# Patient Record
Sex: Female | Born: 1940 | Race: Black or African American | Hispanic: No | Smoking: Never smoker
Health system: Southern US, Community
[De-identification: ages and names within clinical notes are randomized; demographics above are authoritative.]

## PROBLEM LIST (undated history)

## (undated) DIAGNOSIS — H9190 Unspecified hearing loss, unspecified ear: Secondary | ICD-10-CM

## (undated) DIAGNOSIS — K7460 Unspecified cirrhosis of liver: Secondary | ICD-10-CM

## (undated) DIAGNOSIS — B1920 Unspecified viral hepatitis C without hepatic coma: Secondary | ICD-10-CM

## (undated) DIAGNOSIS — H6690 Otitis media, unspecified, unspecified ear: Secondary | ICD-10-CM

## (undated) DIAGNOSIS — J189 Pneumonia, unspecified organism: Secondary | ICD-10-CM

## (undated) DIAGNOSIS — M069 Rheumatoid arthritis, unspecified: Secondary | ICD-10-CM

## (undated) HISTORY — PX: INNER EAR SURGERY: SHX679

## (undated) HISTORY — DX: Pneumonia, unspecified organism: J18.9

## (undated) HISTORY — DX: Unspecified cirrhosis of liver: K74.60

## (undated) HISTORY — DX: Unspecified viral hepatitis C without hepatic coma: B19.20

## (undated) HISTORY — PX: EYE SURGERY: SHX253

---

## 1998-02-20 ENCOUNTER — Emergency Department (HOSPITAL_COMMUNITY): Admission: EM | Admit: 1998-02-20 | Discharge: 1998-02-20 | Payer: Self-pay | Admitting: Emergency Medicine

## 2000-06-01 ENCOUNTER — Emergency Department (HOSPITAL_COMMUNITY): Admission: EM | Admit: 2000-06-01 | Discharge: 2000-06-01 | Payer: Self-pay | Admitting: Emergency Medicine

## 2000-06-01 ENCOUNTER — Encounter: Payer: Self-pay | Admitting: Emergency Medicine

## 2001-02-10 ENCOUNTER — Emergency Department (HOSPITAL_COMMUNITY): Admission: EM | Admit: 2001-02-10 | Discharge: 2001-02-10 | Payer: Self-pay | Admitting: Emergency Medicine

## 2002-11-02 ENCOUNTER — Encounter: Admission: RE | Admit: 2002-11-02 | Discharge: 2002-11-02 | Payer: Self-pay | Admitting: Otolaryngology

## 2002-11-02 ENCOUNTER — Encounter: Payer: Self-pay | Admitting: Otolaryngology

## 2005-07-04 ENCOUNTER — Emergency Department (HOSPITAL_COMMUNITY): Admission: EM | Admit: 2005-07-04 | Discharge: 2005-07-04 | Payer: Self-pay | Admitting: Emergency Medicine

## 2006-03-28 ENCOUNTER — Emergency Department (HOSPITAL_COMMUNITY): Admission: EM | Admit: 2006-03-28 | Discharge: 2006-03-28 | Payer: Self-pay | Admitting: Emergency Medicine

## 2006-04-30 ENCOUNTER — Emergency Department (HOSPITAL_COMMUNITY): Admission: EM | Admit: 2006-04-30 | Discharge: 2006-05-01 | Payer: Self-pay | Admitting: Emergency Medicine

## 2006-05-17 ENCOUNTER — Encounter: Admission: RE | Admit: 2006-05-17 | Discharge: 2006-05-17 | Payer: Self-pay | Admitting: Otolaryngology

## 2007-03-21 ENCOUNTER — Emergency Department (HOSPITAL_COMMUNITY): Admission: EM | Admit: 2007-03-21 | Discharge: 2007-03-21 | Payer: Self-pay | Admitting: Emergency Medicine

## 2007-04-28 ENCOUNTER — Ambulatory Visit: Payer: Self-pay | Admitting: Internal Medicine

## 2007-04-28 LAB — CONVERTED CEMR LAB
ALT: 33 units/L (ref 0–35)
AST: 39 units/L — ABNORMAL HIGH (ref 0–37)
Albumin: 3.4 g/dL — ABNORMAL LOW (ref 3.5–5.2)
Alkaline Phosphatase: 81 units/L (ref 39–117)
BUN: 16 mg/dL (ref 6–23)
Basophils Absolute: 0 10*3/uL (ref 0.0–0.1)
Basophils Relative: 0.9 % (ref 0.0–1.0)
Bilirubin, Direct: 0.1 mg/dL (ref 0.0–0.3)
Calcium: 9.1 mg/dL (ref 8.4–10.5)
Chloride: 111 meq/L (ref 96–112)
Folate: 18.1 ng/mL
GFR calc Af Amer: 129 mL/min
GFR calc non Af Amer: 107 mL/min
Glucose, Bld: 84 mg/dL (ref 70–99)
HCT: 33.3 % — ABNORMAL LOW (ref 36.0–46.0)
HCV Ab: POSITIVE — AB
Hemoglobin: 10.7 g/dL — ABNORMAL LOW (ref 12.0–15.0)
Hep A IgM: NEGATIVE
Hep B C IgM: NEGATIVE
Hepatitis B Surface Ag: NEGATIVE
MCHC: 32 g/dL (ref 30.0–36.0)
Monocytes Absolute: 0.6 10*3/uL (ref 0.2–0.7)
Monocytes Relative: 11.7 % — ABNORMAL HIGH (ref 3.0–11.0)
RBC: 4.54 M/uL (ref 3.87–5.11)
RDW: 13.6 % (ref 11.5–14.6)
Saturation Ratios: 15.2 % — ABNORMAL LOW (ref 20.0–50.0)
TSH: 0.47 microintl units/mL (ref 0.35–5.50)
Vitamin B-12: 498 pg/mL (ref 211–911)

## 2007-05-04 ENCOUNTER — Encounter: Admission: RE | Admit: 2007-05-04 | Discharge: 2007-05-04 | Payer: Self-pay | Admitting: Internal Medicine

## 2007-05-04 ENCOUNTER — Ambulatory Visit: Payer: Self-pay | Admitting: Internal Medicine

## 2007-05-19 ENCOUNTER — Ambulatory Visit: Payer: Self-pay | Admitting: Internal Medicine

## 2007-05-19 LAB — CONVERTED CEMR LAB: Prothrombin Time: 11.7 s (ref 10.9–13.3)

## 2007-05-29 IMAGING — CT CT ORBIT/TEMPORAL/IAC W/O CM
4 of 6 series · 14 of 30 positions shown, 15 images · IV contrast (agent unspecified)
Comparison: Head CT, 04/30/06

CLINICAL DATA: 64 year old female with chronic ear disease and perforation.  Bilateral chronic otomastoiditis.  
CT TEMPORAL BONES WITHOUT CONTRAST:
TECHNIQUE: Axial and coronal plane CT imaging of the petrous temporal bones was performed with thin-collimation image reconstruction.  No intravenous contrast was administered.

[Series 2: coronal/check recons 2 & 3 · axial · 0.39mm/px · z∈[+10,+55]mm · 4 of 112 slices shown, 5 images]
[im 23/112  brain]
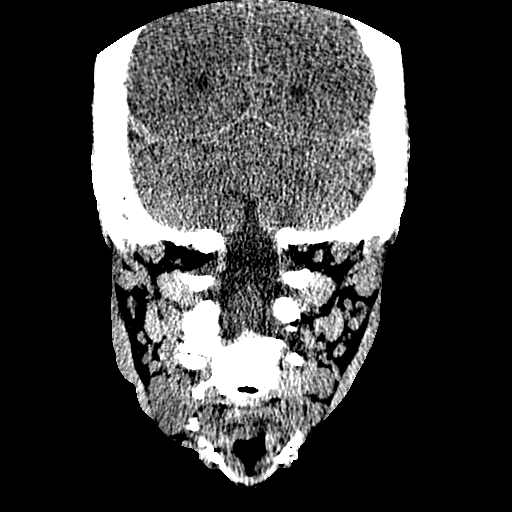
[im 23/112  bone]
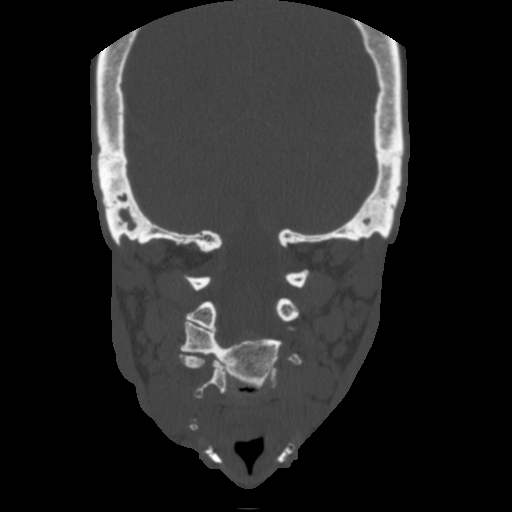
[im 45/112  bone]
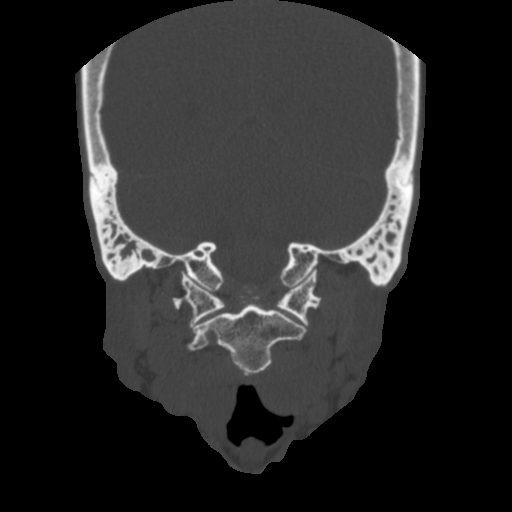
[im 67/112  bone]
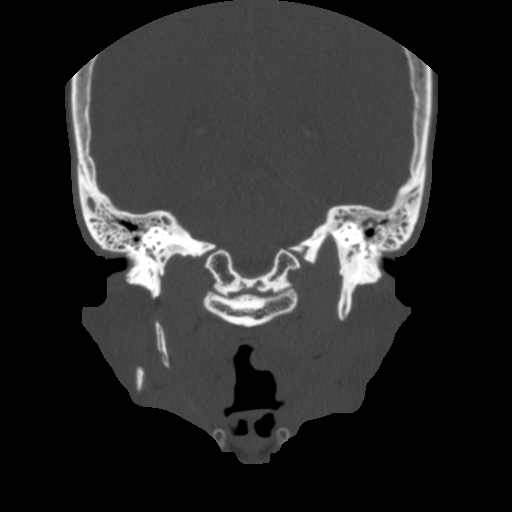
[im 89/112  bone]
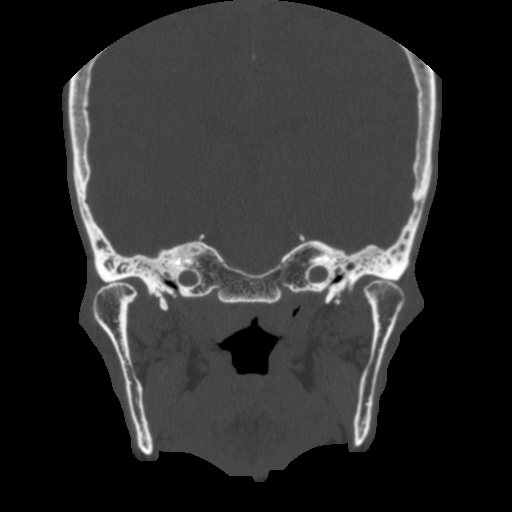

[Series 3: recon 2: coronal/check recons 2 · axial · 0.20mm/px · z∈[-8,+36]mm · 4 of 112 slices shown]
[im 23/112  bone]
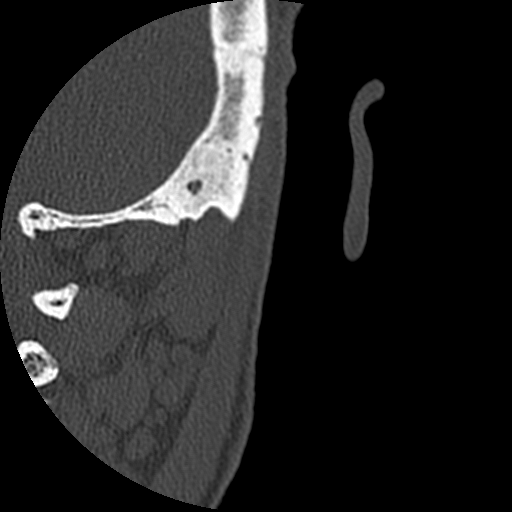
[im 45/112  bone]
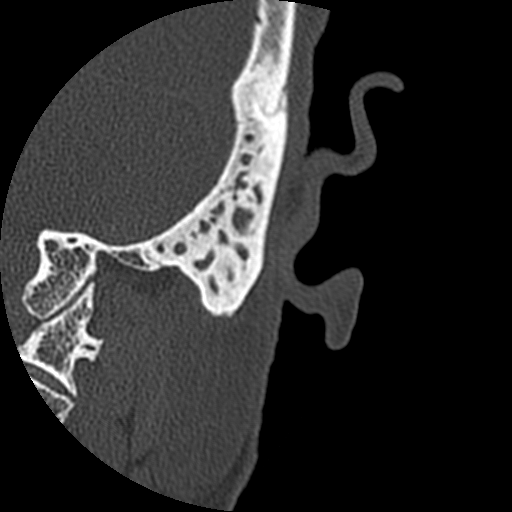
[im 67/112  bone]
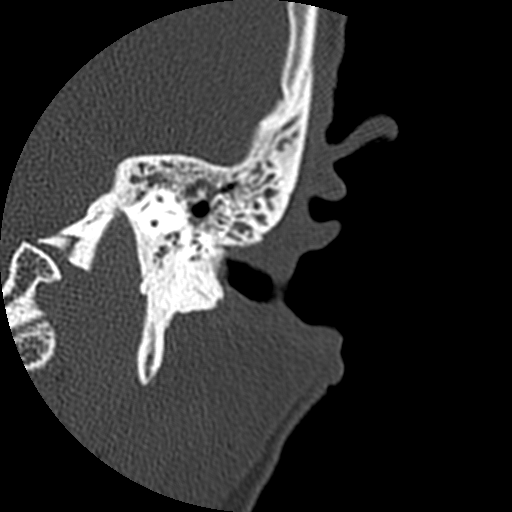
[im 89/112  bone]
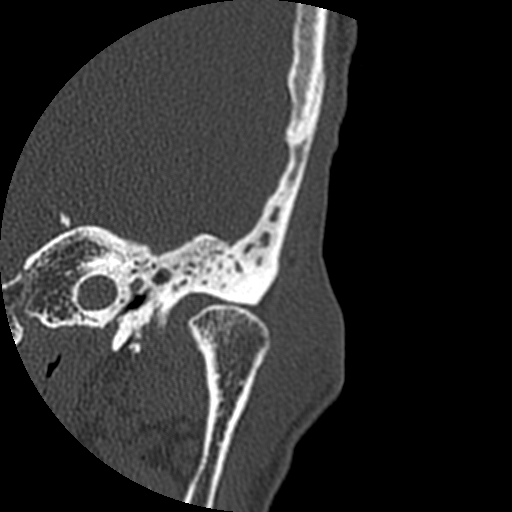

[Series 4: recon 3: coronal/check recons 2 · axial · 0.20mm/px · z∈[-8,+36]mm · 4 of 112 slices shown]
[im 23/112  bone]
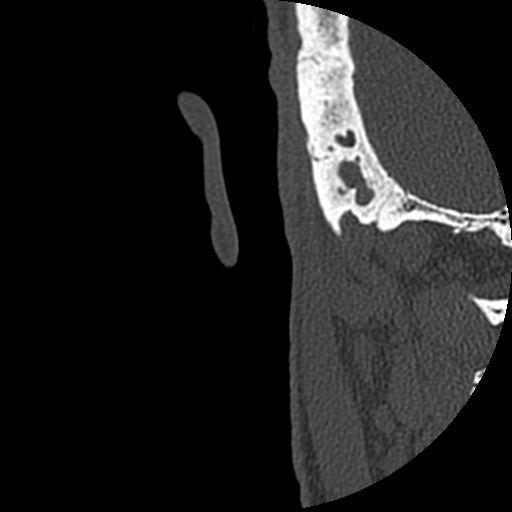
[im 45/112  bone]
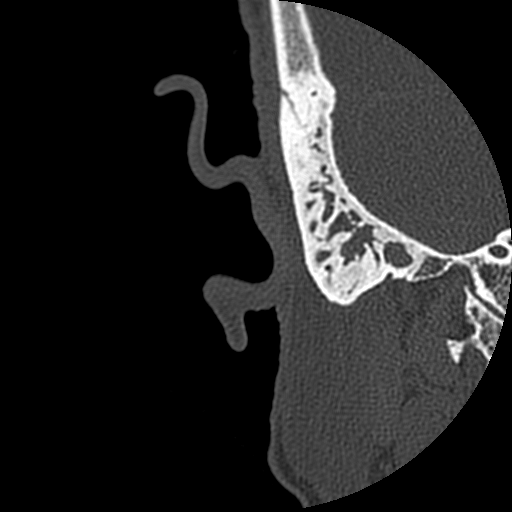
[im 67/112  bone]
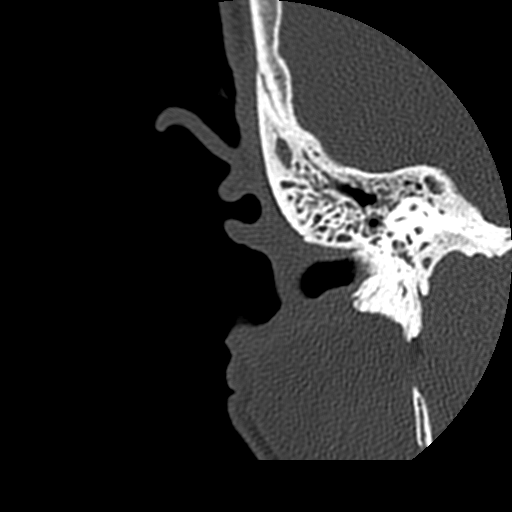
[im 89/112  bone]
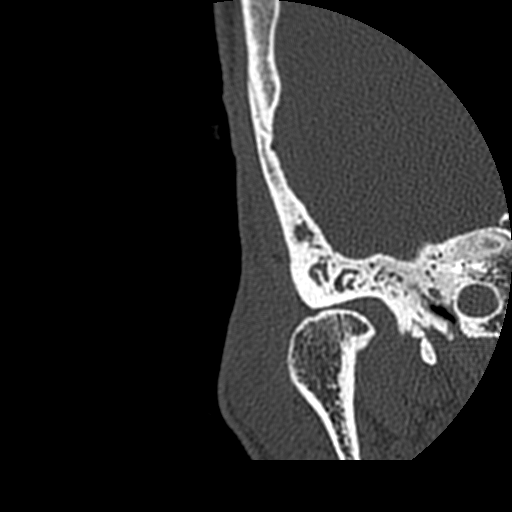

[Series 6: axial/check for recons 2 & 3 · axial · 0.33mm/px · z∈[-0,+16]mm · 2 of 80 slices shown]
[im 27/80  bone]
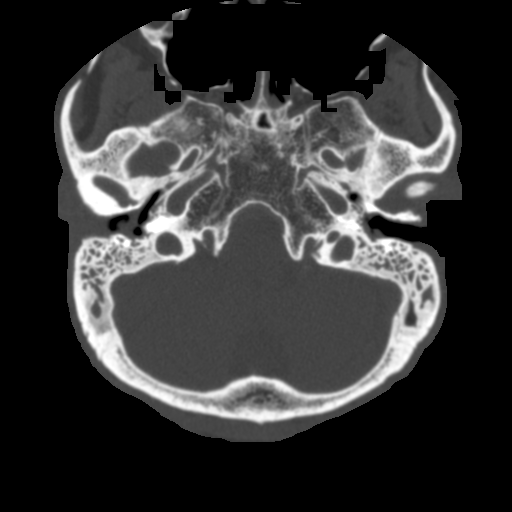
[im 53/80  bone]
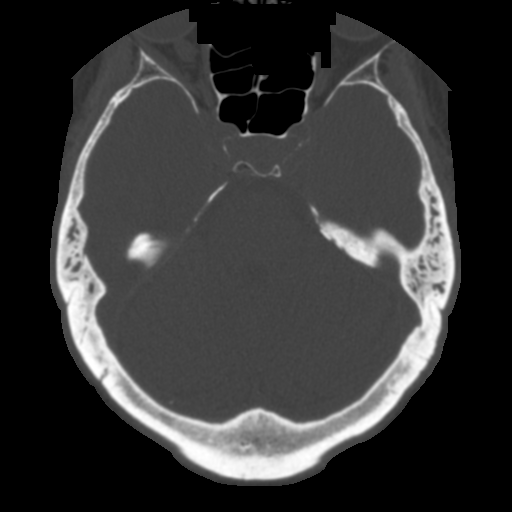

[14 of 30 positions shown; findings below may reference images not displayed]

FINDINGS: Calcifications within the right ear pinna are redemonstrated.  Limited imaging of the brain is unremarkable.  
Imaging of the right temporal bone redemonstrates findings of chronic otomastoiditis.  There is sclerosis of the bone and chronic effusion.  There is thickening along the medial aspect of the external auditory canal and tympanic membrane where there appears to be perforation of the inferior aspect of the tympanic membrane and the middle ear ossicles appear to be intact.  There is no definite osseous destruction.  Inner ear structures are normally formed.  There is prominent soft tissue within the middle ear cavity.  This is slightly more prominent than on the study of 6994.  
Similar findings of sclerosis and opacification of the left mastoid air cells is evident.  There is chronic perforation of the left TM as was seen on the study of 6994.  Abnormal soft tissue is present within the middle ear cavity, but without osseous destruction.  The middle ear ossicles are intact.  Inner ear structures are normally formed.
IMPRESSION: 1.  Bilateral chronic otomastoiditis.
2.  Persistent soft tissue within the middle ear cavity likely inflammatory in nature, but without discrete mass or osseous destruction to suggest the diagnosis of cholesteatoma. 
3.  Chronic perforation of the left tympanic membrane.

## 2007-06-08 ENCOUNTER — Ambulatory Visit: Payer: Self-pay | Admitting: Gastroenterology

## 2007-06-22 ENCOUNTER — Ambulatory Visit: Payer: Self-pay | Admitting: Gastroenterology

## 2007-06-27 ENCOUNTER — Encounter: Payer: Self-pay | Admitting: Internal Medicine

## 2007-06-27 ENCOUNTER — Ambulatory Visit: Payer: Self-pay | Admitting: Internal Medicine

## 2007-06-27 DIAGNOSIS — H409 Unspecified glaucoma: Secondary | ICD-10-CM | POA: Insufficient documentation

## 2007-06-27 DIAGNOSIS — M1710 Unilateral primary osteoarthritis, unspecified knee: Secondary | ICD-10-CM

## 2007-06-27 DIAGNOSIS — Z8659 Personal history of other mental and behavioral disorders: Secondary | ICD-10-CM | POA: Insufficient documentation

## 2007-06-27 DIAGNOSIS — H60509 Unspecified acute noninfective otitis externa, unspecified ear: Secondary | ICD-10-CM

## 2007-06-27 DIAGNOSIS — B1710 Acute hepatitis C without hepatic coma: Secondary | ICD-10-CM

## 2007-06-27 DIAGNOSIS — E119 Type 2 diabetes mellitus without complications: Secondary | ICD-10-CM | POA: Insufficient documentation

## 2007-06-27 DIAGNOSIS — H7090 Unspecified mastoiditis, unspecified ear: Secondary | ICD-10-CM | POA: Insufficient documentation

## 2007-06-27 DIAGNOSIS — J309 Allergic rhinitis, unspecified: Secondary | ICD-10-CM | POA: Insufficient documentation

## 2007-07-06 ENCOUNTER — Telehealth (INDEPENDENT_AMBULATORY_CARE_PROVIDER_SITE_OTHER): Payer: Self-pay | Admitting: *Deleted

## 2007-07-07 ENCOUNTER — Ambulatory Visit: Payer: Self-pay | Admitting: Internal Medicine

## 2007-07-14 ENCOUNTER — Emergency Department (HOSPITAL_COMMUNITY): Admission: EM | Admit: 2007-07-14 | Discharge: 2007-07-14 | Payer: Self-pay | Admitting: Emergency Medicine

## 2007-07-15 ENCOUNTER — Telehealth: Payer: Self-pay | Admitting: Internal Medicine

## 2007-07-19 ENCOUNTER — Encounter: Payer: Self-pay | Admitting: Internal Medicine

## 2007-07-20 ENCOUNTER — Ambulatory Visit: Payer: Self-pay | Admitting: Internal Medicine

## 2007-07-20 DIAGNOSIS — L02818 Cutaneous abscess of other sites: Secondary | ICD-10-CM

## 2007-07-20 DIAGNOSIS — F22 Delusional disorders: Secondary | ICD-10-CM | POA: Insufficient documentation

## 2007-07-20 DIAGNOSIS — L03818 Cellulitis of other sites: Secondary | ICD-10-CM

## 2007-07-20 LAB — CONVERTED CEMR LAB
BUN: 10 mg/dL (ref 6–23)
Cholesterol: 204 mg/dL (ref 0–200)
Creatinine, Ser: 0.7 mg/dL (ref 0.4–1.2)
GFR calc Af Amer: 108 mL/min
HDL: 43.3 mg/dL (ref >39.0)
Hgb A1c MFr Bld: 6.2 % — ABNORMAL HIGH (ref 4.6–6.0)
Potassium: 3.8 meq/L (ref 3.5–5.1)
Sodium: 143 meq/L (ref 135–145)
Triglycerides: 157 mg/dL — ABNORMAL HIGH (ref 0–149)
VLDL: 31 mg/dL (ref 0–40)

## 2007-07-21 ENCOUNTER — Telehealth (INDEPENDENT_AMBULATORY_CARE_PROVIDER_SITE_OTHER): Payer: Self-pay | Admitting: *Deleted

## 2007-07-25 ENCOUNTER — Ambulatory Visit: Payer: Self-pay | Admitting: Internal Medicine

## 2007-07-25 DIAGNOSIS — D649 Anemia, unspecified: Secondary | ICD-10-CM

## 2007-07-26 ENCOUNTER — Telehealth (INDEPENDENT_AMBULATORY_CARE_PROVIDER_SITE_OTHER): Payer: Self-pay | Admitting: *Deleted

## 2007-07-26 LAB — CONVERTED CEMR LAB
Iron: 94 ug/dL (ref 42–145)
Transferrin: 269.8 mg/dL (ref >=212.0)
Vitamin B-12: 605 pg/mL (ref 211–911)

## 2007-08-24 ENCOUNTER — Ambulatory Visit: Payer: Self-pay | Admitting: Internal Medicine

## 2007-08-24 DIAGNOSIS — H6690 Otitis media, unspecified, unspecified ear: Secondary | ICD-10-CM | POA: Insufficient documentation

## 2007-09-16 ENCOUNTER — Ambulatory Visit: Payer: Self-pay | Admitting: Gastroenterology

## 2007-09-16 ENCOUNTER — Encounter: Payer: Self-pay | Admitting: Internal Medicine

## 2007-12-09 ENCOUNTER — Encounter: Payer: Self-pay | Admitting: Internal Medicine

## 2007-12-26 ENCOUNTER — Encounter: Admission: RE | Admit: 2007-12-26 | Discharge: 2007-12-26 | Payer: Self-pay | Admitting: Orthopedic Surgery

## 2008-03-03 ENCOUNTER — Other Ambulatory Visit: Payer: Self-pay

## 2008-03-03 ENCOUNTER — Emergency Department: Payer: Self-pay | Admitting: Emergency Medicine

## 2008-03-22 ENCOUNTER — Ambulatory Visit: Payer: Self-pay | Admitting: Gastroenterology

## 2008-05-01 ENCOUNTER — Emergency Department: Payer: Self-pay | Admitting: Emergency Medicine

## 2011-01-19 ENCOUNTER — Other Ambulatory Visit: Payer: Self-pay | Admitting: Otolaryngology

## 2011-01-19 DIAGNOSIS — H7010 Chronic mastoiditis, unspecified ear: Secondary | ICD-10-CM

## 2011-01-23 ENCOUNTER — Ambulatory Visit
Admission: RE | Admit: 2011-01-23 | Discharge: 2011-01-23 | Disposition: A | Discharge status: Home / Self Care | Payer: Medicare Other | Source: Ambulatory Visit | Attending: Otolaryngology | Admitting: Otolaryngology

## 2011-01-23 DIAGNOSIS — H7010 Chronic mastoiditis, unspecified ear: Secondary | ICD-10-CM

## 2011-06-11 ENCOUNTER — Emergency Department (HOSPITAL_COMMUNITY): Payer: Medicare Other

## 2011-06-11 ENCOUNTER — Emergency Department (HOSPITAL_COMMUNITY)
Admission: EM | Admit: 2011-06-11 | Discharge: 2011-06-12 | Disposition: A | Discharge status: Home / Self Care | Payer: Medicare Other | Source: Home / Self Care | Attending: Emergency Medicine | Admitting: Emergency Medicine

## 2011-06-11 LAB — COMPREHENSIVE METABOLIC PANEL
ALT: 33 U/L (ref 0–35)
AST: 56 U/L — ABNORMAL HIGH (ref 0–37)
Albumin: 3.5 g/dL (ref 3.5–5.2)
Alkaline Phosphatase: 85 U/L (ref 39–117)
Potassium: 4.2 mEq/L (ref 3.5–5.1)
Sodium: 142 mEq/L (ref 135–145)
Total Protein: 8.6 g/dL — ABNORMAL HIGH (ref 6.0–8.3)

## 2011-06-11 LAB — CBC
Platelets: 366 10*3/uL (ref 150–400)
RBC: 5.19 MIL/uL — ABNORMAL HIGH (ref 3.87–5.11)
RDW: 13.6 % (ref 11.5–15.5)
WBC: 7.7 10*3/uL (ref 4.0–10.5)

## 2011-06-11 LAB — URINALYSIS, ROUTINE W REFLEX MICROSCOPIC
Bilirubin Urine: NEGATIVE
Ketones, ur: NEGATIVE mg/dL
Nitrite: NEGATIVE
Protein, ur: NEGATIVE mg/dL
pH: 6 (ref 5.0–8.0)

## 2011-06-11 LAB — RAPID URINE DRUG SCREEN, HOSP PERFORMED
Amphetamines: NOT DETECTED
Barbiturates: NOT DETECTED
Benzodiazepines: NOT DETECTED
Cocaine: NOT DETECTED
Tetrahydrocannabinol: NOT DETECTED

## 2011-06-11 LAB — DIFFERENTIAL
Eosinophils Absolute: 0 10*3/uL (ref 0.0–0.7)
Eosinophils Relative: 0 % (ref 0–5)
Lymphocytes Relative: 13 % (ref 12–46)
Monocytes Absolute: 0.5 10*3/uL (ref 0.1–1.0)
Neutrophils Relative %: 80 % — ABNORMAL HIGH (ref 43–77)

## 2011-06-11 LAB — ETHANOL: Alcohol, Ethyl (B): <11 mg/dL (ref 0–11)

## 2011-06-12 ENCOUNTER — Inpatient Hospital Stay (HOSPITAL_COMMUNITY)
Admission: AD | Admit: 2011-06-12 | Discharge: 2011-06-15 | DRG: 884 | Disposition: A | Discharge status: Home / Self Care | Payer: Medicare Other | Attending: Psychiatry | Admitting: Psychiatry

## 2011-06-12 DIAGNOSIS — F0390 Unspecified dementia without behavioral disturbance: Secondary | ICD-10-CM

## 2011-06-12 DIAGNOSIS — IMO0002 Reserved for concepts with insufficient information to code with codable children: Secondary | ICD-10-CM

## 2011-06-12 DIAGNOSIS — M810 Age-related osteoporosis without current pathological fracture: Secondary | ICD-10-CM

## 2011-06-12 DIAGNOSIS — H9190 Unspecified hearing loss, unspecified ear: Secondary | ICD-10-CM

## 2011-06-12 DIAGNOSIS — X58XXXA Exposure to other specified factors, initial encounter: Secondary | ICD-10-CM

## 2011-06-12 DIAGNOSIS — G309 Alzheimer's disease, unspecified: Secondary | ICD-10-CM

## 2011-06-12 DIAGNOSIS — F0280 Dementia in other diseases classified elsewhere without behavioral disturbance: Secondary | ICD-10-CM

## 2011-06-12 DIAGNOSIS — F29 Unspecified psychosis not due to a substance or known physiological condition: Secondary | ICD-10-CM

## 2011-06-13 LAB — VITAMIN B12: Vitamin B-12: 986 pg/mL — ABNORMAL HIGH (ref 211–911)

## 2011-06-13 LAB — RPR: RPR Ser Ql: NONREACTIVE

## 2011-06-13 LAB — TSH: TSH: 0.505 u[IU]/mL (ref 0.350–4.500)

## 2011-06-13 NOTE — Assessment & Plan Note (Signed)
Carol Palmer, PATES NO.:  [id number]  MEDICAL RECORD NO.:  [id number]  LOCATION:  0403                          FACILITY:  [facility]  PHYSICIAN:  Eulogio Ditch, MD DATE OF BIRTH:  1941/07/30  DATE OF ADMISSION:  06/12/2011 DATE OF DISCHARGE:                      PSYCHIATRIC ADMISSION ASSESSMENT   HISTORY OF PRESENT ILLNESS:  The patient is here on petition and was seen with Dr. Rogers Blocker.  Her petition papers state that she is seeing and hearing things that are not there, wandering the streets.  The patient was assessed in the emergency department with altered mental status.  She was unable and unable to provide information, endorsing visual hallucinations, seeing strangers in her house, one in her closet, two behind the couch.  She states that she left and went to her neighbors.  She also was endorsing being "sprinkled with dust by boys and girls that smoked her."  She denies any auditory hallucinations. Denies any suicidal or homicidal thoughts.  She reports having difficulty hearing and is on medications for cataracts.  PAST PSYCHIATRIC HISTORY:  First admission to [facility]. No current or past psychiatric treatment.  SOCIAL HISTORY:  The patient is 70 years old, she lives alone.  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  FAMILY HISTORY:  None.  ALCOHOL AND DRUG HISTORY:  She denies any alcohol or substance use.  PRIMARY CARE PROVIDER:  The patient list [facility].  MEDICAL PROBLEMS:  A history of chronic mastoiditis that was noted on her CT scan and cataracts, hard of hearing.  MEDICATIONS:  Are unavailable at this time.  DRUG ALLERGIES:  None noted.  PHYSICAL EXAMINATION:  The patient was seen in the emergency department where it was noted the patient was agitated and hostile.  She had a chest x-ray that was within normal limits.  Urinalysis was negative. Urine drug  screen was negative.  Salicylate level less than 2, alcohol level 11, glucose of 116, RBC of 5.19 and ER had also noted that the patient had a washcloth near her buttocks and was noted to have a stage II ulcer.  MENTAL STATUS EXAM:  She is alert, asking to go home.  She is somewhat unkempt, very thin in appearance, having some problems with long-term memory.  Unable to recall exact dates.  Very open about her visual hallucinations.  Does not appear to be fearful and she denies any auditory hallucinations or suicidal or homicidal thoughts.  DIAGNOSES:  Axis I:  Dementia, NOS, behavioral disturbance. Axis II:  Deferred. Axis III:  History of cataracts, hard of hearing. Axis IV:  Deferred at this time. Axis V:  Current is 30.  PLAN:  Is to obtain further labs, TSH, B12 and folate level.  We will have Ensure available for nutritional supplements.  We will have Haldol at bedtime to aid with psychotic symptoms.  Contact daughter for concerns and baseline.  We will also have nursing monitor her wound and aid with her ADLs.  Her tentative length of stay at this time is 3-4 days.     Landry Corporal, N.P.   ______________________________ Eulogio Ditch, MD  JO/MEDQ  D:  06/12/2011  T:  06/12/2011  Job:  [id number]  Electronically Signed by Limmie Patricia.P. on 06/12/2011 01:57:49 PM Electronically Signed by Eulogio Ditch  on 06/13/2011 11:38:26 AM

## 2011-06-15 LAB — FOLATE RBC: RBC Folate: 1237 ng/mL — ABNORMAL HIGH (ref >=366)

## 2011-06-23 LAB — BASIC METABOLIC PANEL
BUN: 15
Creatinine, Ser: 1.03
GFR calc non Af Amer: 54 — ABNORMAL LOW

## 2011-06-23 LAB — CBC
MCV: 72.1 — ABNORMAL LOW
Platelets: 650 — ABNORMAL HIGH
RDW: 13.6
WBC: 4.5

## 2011-06-23 LAB — DIFFERENTIAL
Basophils Absolute: 0
Eosinophils Absolute: 0.2
Lymphs Abs: 1.7
Neutrophils Relative %: 49

## 2011-06-23 LAB — URINALYSIS, ROUTINE W REFLEX MICROSCOPIC
Ketones, ur: NEGATIVE
Nitrite: NEGATIVE
Protein, ur: NEGATIVE

## 2011-07-03 ENCOUNTER — Other Ambulatory Visit: Payer: Self-pay | Admitting: Nurse Practitioner

## 2011-07-03 ENCOUNTER — Ambulatory Visit
Admission: RE | Admit: 2011-07-03 | Discharge: 2011-07-03 | Disposition: A | Discharge status: Home / Self Care | Payer: Medicare Other | Source: Ambulatory Visit | Attending: Nurse Practitioner | Admitting: Nurse Practitioner

## 2011-07-03 DIAGNOSIS — R05 Cough: Secondary | ICD-10-CM

## 2011-07-09 NOTE — Discharge Summary (Signed)
Carol Palmer, Carol Palmer             ACCOUNT NO.:  [id number]  MEDICAL RECORD NO.:  [id number]  LOCATION:  0402                          FACILITY:  [facility]  PHYSICIAN:  Eulogio Ditch, MD DATE OF BIRTH:  12-29-1940  DATE OF ADMISSION:  06/12/2011 DATE OF DISCHARGE:  06/15/2011                              DISCHARGE SUMMARY   IDENTIFYING INFORMATION:  This is a 70 year old female.  This is an involuntary admission.  HISTORY OF PRESENT ILLNESS:  Carol Palmer presents with some psychotic symptoms.  Complained of seeing strangers in her house, people walking around, someone hiding in the closet, two behind the sofa in her living room.  Denying any auditory hallucinations and denied any dangerous thoughts.  This is a 70 year old female who lives alone.  Has 3 children.  Previously worked as a Conservator, museum/gallery.  MEDICAL EVALUATION:  She was medically evaluated in our emergency room where a full physical exam and review of systems was documented. Urinalysis was noted to be negative.  Urine drug screen negative. Salicylate level less than 2.  Alcohol screen negative.  PE revealed a stage II decubitus ulcer on her buttocks.  COURSE OF HOSPITALIZATION:  She was admitted to our dual diagnoses unit and given a provisional diagnosis of psychosis NOS, rule out dementia. She was initially given trazodone 50 mg at night p.r.n. insomnia, Haldol 2 mg p.o. at bedtime. Additional diagnostic studies revealed a TSH of 0.505, B12 level of 986 ng/mL and RPR nonreactive.  She was initially evaluated by Dr. Kathryne Sharper and transferred to the care of Dr. Rogers Blocker on the day of discharge.  We learned she had no previous history of taking psychotropic medications or psychiatric hospitalizations.  CT scan had been performed and was found to be negative for brain atrophy.  She gave [place] permission to speak with her daughter, who confirmed that her history is psychiatric problems was negative.  Daughter  pointed out that the patient is hard of hearing and sometimes misunderstands people and replies inappropriately to questions.  She had previously seen a medical doctor who had been concerned about the possibility of dementia.  She tolerated the Haldol well, but her thought process remained circumstantial but no suicidal or other dangerous thoughts.  She continued to have no dangerous ideas, was not agitated and was ready for discharge on October 8 tolerating the Haldol well.  Family was involved and was in agreement with helping her follow up with further medical evaluations.  She was ready for discharge by October 8.  DISCHARGE PLAN:  To follow up with Starling Manns at [facility] October 15 at 10:40.  DISCHARGE DIAGNOSES:  Axis I:  Dementia, NOS. Axis II:  Deferred. Axis III:  No diagnosis. Axis IV:  No diagnosis. Axis V:  Current 55, past year not known.  DISCHARGE MEDICATIONS ARE: 1. Haldol 2 mg at bedtime. 2. Dorzolamide ophthalmic 2% 1 drop in the left eye q.12 hours. 3. Elocon 0.1% ophthalmic drops, 1 drop both eyes once daily. 4. Lumigan 0.3 mg ophthalmic 1 drop in the left eye daily. 5. Pilocarpine 1 drop in the left eye 4 times daily. 6. Prevacid 30 mg daily. 7. Timolol ophthalmic solution 0.5% 1  drop in the left eye daily.     Margaret A. Lorin Picket, N.P.   ______________________________ Eulogio Ditch, MD    MAS/MEDQ  D:  07/07/2011  T:  07/08/2011  Job:  [id number]  Electronically Signed by Kari Baars N.P. on 07/09/2011 08:33:15 AM Electronically Signed by Eulogio Ditch  on 07/09/2011 12:09:22 PM

## 2011-08-24 ENCOUNTER — Ambulatory Visit (INDEPENDENT_AMBULATORY_CARE_PROVIDER_SITE_OTHER): Payer: Medicare Other | Admitting: Internal Medicine

## 2011-08-24 ENCOUNTER — Encounter: Payer: Self-pay | Admitting: Internal Medicine

## 2011-08-24 VITALS — BP 116/69 | HR 76 | Temp 98.2°F | Wt 142.0 lb

## 2011-08-24 DIAGNOSIS — F22 Delusional disorders: Secondary | ICD-10-CM

## 2011-08-24 NOTE — Progress Notes (Signed)
INFECTIOUS DISEASES INITIAL CLINIC VISIT  RFV: chronic pseudomonas infection/delusional parasitosis  Patient ID: Carol Palmer, female   DOB: [birth year], 70 y.o.   MRN: [id number] Carol Palmer is a 70yo AAF with history of HepC, DM, recurrent OM. She has been seen by her PCP in beginning of November 2012. The patient reports just finishing a course of Bactrim for strep and pseudomonas. She states that she has chronic infection of her ears which has been diagnosed by her PCP as well as been seen by various ENTs and other subspecialist. She states that she feels the pseudomonas is travelling through her scalps causing seeds to come out. Secondly, she feels that this infection is coursing through her body, feeding from her GU tract, and thus that is why is it being found in her urine. She states that she is overwhelmed by this fungus and has collected samples to show at this visit. She is interested in a cure. She states that roughly ever 30days she notices bumps arising behind her ear and on her scalp. They never ulcerate, but do cause her to itch. Theses lesions are not necessarily localized to head and neck but she says that they are on her back and legs. She states that this infection has caused her to lose her hair and eyebrows. She would like [place] to culture her ears, skin, rectum, vagina, under her nails, and under her toes to understand the extent of her illness. She denies any recent trauma to ears, she does not use q-tips  She denies fever/chills/nightsweats/ear pain/ear drainage/dysuria. She does feel that she has pruritis and arthralgias due to osteoarthritis.  ROS: Review of Systems  Constitutional: Negative for fever, chills, diaphoresis, activity change, appetite change, fatigue and unexpected weight change.  HENT: Negative for congestion, sore throat, rhinorrhea, sneezing, trouble swallowing and sinus pressure.  Eyes: Negative for photophobia and visual disturbance.  Respiratory: she has  intermittent cough. no chest tightness, shortness of breath, wheezing and stridor.  Cardiovascular: Negative for chest pain, palpitations and leg swelling.  Gastrointestinal: Negative for nausea, vomiting, abdominal pain, diarrhea, constipation, blood in stool, abdominal distention and anal bleeding.  Genitourinary: Negative for dysuria, hematuria, flank pain and difficulty urinating.  Musculoskeletal: Negative for myalgias, back pain, joint swelling, arthralgias and gait problem.  Skin: Negative for color change, pallor, rash and wound.  Neurological: Negative for dizziness, tremors, weakness and light-headedness.  Hematological: Negative for adenopathy. Does not bruise/bleed easily.  Psychiatric/Behavioral: Negative for behavioral problems, confusion, sleep disturbance, dysphoric mood, decreased concentration and agitation.    No Known Allergies   Active Ambulatory Problems    Diagnosis Date Noted  . HEPATITIS C 06/27/2007  . DIABETES MELLITUS, TYPE II 06/27/2007  . UNSPECIFIED ANEMIA 07/25/2007  . DELUSIONAL DISORDER 07/20/2007  . GLAUCOMA NOS 06/27/2007  . OTITIS EXTERNA, ACUTE NEC 06/27/2007  . OTITIS MEDIA, ACUTE, BILATERAL 08/24/2007  . MASTOIDITIS NOS 06/27/2007  . ALLERGIC RHINITIS 06/27/2007  . CELLULITIS, SCALP 07/20/2007  . 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 HX, PERSONAL, MENTAL DISORDER NOS 06/27/2007   Resolved Ambulatory Problems    Diagnosis Date Noted  . No Resolved Ambulatory Problems   No Additional Past Medical History   Current Outpatient Prescriptions  Medication Sig Dispense Refill  . dorzolamide (TRUSOPT) 2 % ophthalmic solution Apply 1 drop to eye 3 (three) times daily.        Marland Kitchen HYDROcodone-acetaminophen (VICODIN) 5-500 MG per tablet Take 1 tablet by mouth every 6 (six) hours as needed.        Marland Kitchen  lansoprazole (PREVACID) 30 MG capsule Take 30 mg by mouth every 12 (twelve) hours. Take 1 cap by mouth every 12 hours before meals in combination  with amoxicillin and clarithromycin.       . mometasone (ELOCON) 0.1 % cream Apply 1 application topically daily.        Marland Kitchen neomycin-polymyxin-hydrocortisone (CORTISPORIN) 3.5-10000-1 otic suspension Place 4 drops in ear(s) 3 (three) times daily.        . pilocarpine (PILOCAR) 1 % ophthalmic solution Apply 1 drop to eye every 12 (twelve) hours.        Marland Kitchen sulfamethoxazole-trimethoprim (BACTRIM DS,SEPTRA DS) 800-160 MG per tablet Take 1 tablet by mouth 2 (two) times daily.        . timolol (BETIMOL) 0.5 % ophthalmic solution Apply 1 drop to eye.         History  Substance Use Topics  . Smoking status: Never Smoker   . Smokeless tobacco: Never Used  . 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 She has short grey hair underneath her wig. No alopecia. No scars. No LAD  Eyes:    PERRL, conjunctiva/corneas clear, EOM's intact, fundi    benign, both eyes  Ears:    EAC is clear, non-erythamatous. TM mild scarring. Non-bulging, no lesions  Nose:   Nares normal, septum midline, mucosa normal, no drainage    or sinus tenderness  Throat:   Lips, mucosa, and tongue normal; wears dentures and gums normal  Neck:   Supple, symmetrical, trachea midline, no adenopathy;    thyroid:  no enlargement/tenderness/nodules; no carotid   bruit or JVD  Back:     Symmetric, no curvature, ROM normal, multiple healed hyperpigmented scars for skin lesions or escoriatoin  Lungs:     Clear to auscultation bilaterally, respirations unlabored      Heart:    Regular rate and rhythm, S1 and S2 normal, no murmur, rub    or gallop  Breast Exam:    No tenderness, masses, or nipple abnormality  Abdomen:     Soft, non-tender, bowel sounds active all four quadrants,    no masses, no organomegaly  Buttock:   numerous scarred lesions, hyperpigmented bilaterally, no open lesions     Extremities:   Extremities normal, atraumatic, no cyanosis or edema  Pulses:   2+ and symmetric all extremities  Skin:   As previously mentioned by body site  Lymph nodes:   Cervical, supraclavicular, and axillary nodes normal    ASSESSMENT:    Delusional Parasitosis , 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  At present, she does not exhibit any active cellulitis, nor Otitis Externa. We will not give any further antibiotic courses. I have asked her to see her PCP if she has ongoing infections  She may benefit from taking an antipsychotic, such as geodon or zyprexa. Or referred to psychiatrist for her illness.  It is a pleasure to see Carol Palmer. If you have further questions, please feel free to contact me.  Duke Salvia Drue Second MD MPH [facility] [phone]

## 2011-10-29 ENCOUNTER — Ambulatory Visit (INDEPENDENT_AMBULATORY_CARE_PROVIDER_SITE_OTHER): Payer: Medicare Other | Admitting: Gastroenterology

## 2011-10-29 DIAGNOSIS — B182 Chronic viral hepatitis C: Secondary | ICD-10-CM

## 2011-10-29 DIAGNOSIS — D649 Anemia, unspecified: Secondary | ICD-10-CM

## 2011-10-29 LAB — IBC PANEL

## 2011-10-29 LAB — TSH: TSH: 0.203 u[IU]/mL — ABNORMAL LOW (ref 0.350–4.500)

## 2011-10-30 LAB — COMPLETE METABOLIC PANEL WITH GFR
ALT: 39 U/L — ABNORMAL HIGH (ref 0–35)
AST: 49 U/L — ABNORMAL HIGH (ref 0–37)
Albumin: 3.9 g/dL (ref 3.5–5.2)
Alkaline Phosphatase: 78 U/L (ref 39–117)
Potassium: 4.2 mEq/L (ref 3.5–5.3)
Sodium: 139 mEq/L (ref 135–145)
Total Bilirubin: 0.4 mg/dL (ref 0.3–1.2)
Total Protein: 7.6 g/dL (ref 6.0–8.3)

## 2011-10-30 LAB — CBC WITH DIFFERENTIAL/PLATELET
Basophils Absolute: 0 10*3/uL (ref 0.0–0.1)
Basophils Relative: 0 % (ref 0–1)
Eosinophils Absolute: 0.2 10*3/uL (ref 0.0–0.7)
Hemoglobin: 12 g/dL (ref 12.0–15.0)
MCHC: 30.2 g/dL (ref 30.0–36.0)
Monocytes Relative: 9 % (ref 3–12)
Neutro Abs: 1.8 10*3/uL (ref 1.7–7.7)
Neutrophils Relative %: 35 % — ABNORMAL LOW (ref 43–77)
Platelets: 301 10*3/uL (ref 150–400)
RDW: 13.4 % (ref 11.5–15.5)

## 2011-10-30 LAB — HEPATITIS B CORE ANTIBODY, TOTAL: Hep B Core Total Ab: POSITIVE — AB

## 2011-10-30 LAB — ANTI-NUCLEAR AB-TITER (ANA TITER): ANA Titer 1: 1:160 {titer} — ABNORMAL HIGH

## 2011-10-30 LAB — IRON AND TIBC
%SAT: 28 % (ref 20–55)
TIBC: 380 ug/dL (ref 250–470)

## 2011-10-30 LAB — AFP TUMOR MARKER: AFP-Tumor Marker: 1.6 ng/mL (ref 0.0–8.0)

## 2011-10-30 LAB — ANA: Anti Nuclear Antibody(ANA): POSITIVE — AB

## 2011-10-30 LAB — HEPATITIS B SURFACE ANTIBODY,QUALITATIVE: Hep B S Ab: POSITIVE — AB

## 2011-11-05 NOTE — Progress Notes (Addendum)
NAMEJOHNATHAN, TORTORELLI    MR#:  [id number]      DATE:  10/29/2011  DOB:  [birth year]    cc: Referring physician:  Bebe Liter, FNP-C c/o Dorothyann Peng, MD, [facility], [address], [place], [place] [address], [place] [phone]    REASON FOR REFERRAL:  Positive hepatitis C antibody.   History:  The patient is a 71 year old woman who I have been asked to see in consultation by Ms. Ethelene Browns regarding a positive hepatitis C antibody.  According to the patient, she had no knowledge of her history of hepatitis C until I gather as part of routine lab testing on 04/23/2011, she was found to have abnormal liver tests with an ALT of  36 and an AST of 48. This led to subsequent lab testing on 05/26/2011, that found she was hepatitis B surface antigen negative, but hepatitis C antibody positive. There are no symptoms directly referable to her  history of hepatitis C nor are there symptoms to suggest cryoglobulin mediated or decompensated liver disease.  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 She denies any history  of intravenous or intranasal drug use, tattoos or blood transfusion prior to 1992. She is unsure as to whether she has had her ears pierced in a sterile or unsterile fashion. There is no family history  of liver disease. She has not been vaccinated against hepatitis A or B that she can recall.   PAST MEDICAL HISTORY:  She denies any coronary artery disease, hypertension, dyslipidemia, or dysthyroidism, or diabetes.   PAST SURGICAL HISTORY:  Surgery for glaucoma.    Past psychiatric history:  Denies.   CURRENT MEDICATIONS:  Pilocarpine 2% 1 drop to each eye q.i.d., dorzolamide HCL 2% 1 drop left eye q. 12 hours, timolol 0.5% one drop left eye q. 12 hours, Lumigan 0.03% one drop left eye daily, olive leaf  150 mg daily for joint pains, garlic 1000 mg p.o. daily for joint pains.    Allergies:  Denies.    Habits:  Smoking never. Alcohol as above.   Family history:  As above.    Social history:  She is a retired religious Teacher, music. She is widowed and has 3 children, 1 of her children, a daughter, lives in [place].   REVIEW OF SYSTEMS:  All 10 systems reviewed today on the review of systems form, which was signed and placed in the chart.  CES-D is 3.   The patient reports that once a week she walks on her own unassisted to the grocery store, which is approximately 20 blocks away, and is  able carry her purchases in bags on her own back without having to stop for shortness of breath or chest pain.   PHYSICAL EXAMINATION:   Constitutional:  Appeared stated age without significant peripheral wasting. Vital signs: Height 61 inches, weight 146 pounds, blood pressure 146/87, pulse 57, temperature 96.5 Fahrenheit.  Ears, nose,  mouth, and throat: Unremarkable oropharynx with dentures. There is no thyromegaly or neck masses.  CHEST:  Resonant to percussion. Clear to auscultation.  CARDIOVASCULAR:  Normal S1, S2. No murmurs or rubs. There is no peripheral edema.  ABDOMINAL EXAM:  Normal bowel sounds. No masses or tenderness. I could not appreciate liver edge or spleen tip. Lymphatics: No cervical or  inguinal lymphadenopathy. Central nervous system:  No asterixis or focal  neurologic findings.  Dermatologic:  Anicteric. No palmar erythema. Eyes anicteric sclera. Pupils: Equal and reactive to light.   LABORATORY STUDIES:  From 04/23/2011, CBC was unremarkable other than MCV of 72. Her total bilirubin was 0.3, albumin 4, globulins 4, ALP 95, AST 48, ALT 36.  From 05/26/2011, her TSH was 0.717, which is normal. Hepatitis B surface antigen was negative, C antibody was positive. Triglycerides were 78. ALT was 32 and AST was 41.   Assessment:  The patient is a 71 year old woman with a  history of a positive hepatitis C antibody with preserved synthetic function. There are no obvious contraindications to treating the patient. She is somewhat  older than our usual patients, but reports excellent exercise tolerance. It would still be of value, if she was to be treated for hepatitis C, to get a stress test, which I will have to ask the  patient's referring doctor to do, because I do not have privileges to order this in [place]. Treatment for hepatitis C may induce anemia that could provoke previously unrecognized coronary disease. In addition, I will have to further characterize her disease by genotype and biopsy if genotype 1.  In my discussion today with the patient, I discussed the nature and natural history of hepatitis C. We discussed the significance of genotyping. We discussed the role of biopsy for genotype 1. How this  is accomplished. We discussed treatment pegylated interferon and ribavirin for all genotypes, and the addition of a protease inhibitor if genotype 1. I discussed our treatment protocol. I discussed the  specific system, constitutional, and psychiatric side effects of therapy. We discussed the response rates. We then discussed the risk of contagion. I have explained to the patient several times during the  conversation how arduous this therapy can be. I also wanted to see if she would bring her daughter, so I could have a full discussion with her other family members. The patient stated that she understood the  significance and the seriousness of treatment stating that she use to work, as a Education administrator. She also stated that she was very motivated to be treated   plan:  1. Standard labs. 2. Test genotype. 3. Test for hepatitis A and B immunity. 4. Once I have the genotype back, if she can be genotyped, then I would suggest that the referring doctor order a stress test. 5. If genotype 1, will proceed with liver biopsy to assess severity of  disease, and bring her back thereafter. 6. If genotype 2 or 3, will bring her back thereafter to discuss results. 7. Literature given. 8. I have not tested her IL 28 b as this will not change management. 9. To return in approximately 6-8 week's time to review the results of all testing. I have asked her to consider bringing her daughter to participate in the conversation. She seems somewhat reluctant to do so.            Brooke Dare, MD   ADDENDUM Hepatitis A and B immune.  Genotype pending.  ANA 1:160 - of questionable significance but can be further evaluated on biopsy if biopsy is warranted.  ADDENDUM 11/12/11  Genotype 1a - biopsy ordered.  403 .[id number]  D:  Thu Feb 21 18:15:02 2013 ; T:  Thu Feb 21 19:22:42 2013  Job #:  [id number]

## 2011-11-06 LAB — HEPATITIS C GENOTYPE

## 2011-11-12 ENCOUNTER — Other Ambulatory Visit: Payer: Self-pay | Admitting: Gastroenterology

## 2011-11-12 DIAGNOSIS — B182 Chronic viral hepatitis C: Secondary | ICD-10-CM

## 2011-11-18 ENCOUNTER — Encounter (HOSPITAL_COMMUNITY): Payer: Self-pay | Admitting: Pharmacy Technician

## 2011-11-19 ENCOUNTER — Other Ambulatory Visit (HOSPITAL_COMMUNITY): Payer: Medicare Other

## 2011-11-23 ENCOUNTER — Other Ambulatory Visit: Payer: Self-pay | Admitting: Radiology

## 2011-11-24 ENCOUNTER — Encounter (HOSPITAL_COMMUNITY): Payer: Self-pay

## 2011-11-24 ENCOUNTER — Ambulatory Visit (HOSPITAL_COMMUNITY)
Admission: RE | Admit: 2011-11-24 | Discharge: 2011-11-24 | Disposition: A | Discharge status: Home / Self Care | Payer: Medicare Other | Source: Ambulatory Visit | Attending: Gastroenterology | Admitting: Gastroenterology

## 2011-11-24 DIAGNOSIS — H409 Unspecified glaucoma: Secondary | ICD-10-CM | POA: Insufficient documentation

## 2011-11-24 DIAGNOSIS — K759 Inflammatory liver disease, unspecified: Secondary | ICD-10-CM | POA: Insufficient documentation

## 2011-11-24 DIAGNOSIS — B182 Chronic viral hepatitis C: Secondary | ICD-10-CM | POA: Insufficient documentation

## 2011-11-24 LAB — CBC
MCHC: 32.3 g/dL (ref 30.0–36.0)
MCV: 69.5 fL — ABNORMAL LOW (ref 78.0–100.0)
Platelets: 304 10*3/uL (ref 150–400)
RDW: 13.2 % (ref 11.5–15.5)
WBC: 5.2 10*3/uL (ref 4.0–10.5)

## 2011-11-24 LAB — APTT: aPTT: 32 seconds (ref 24–37)

## 2011-11-24 LAB — PROTIME-INR: INR: 1.04 (ref 0.00–1.49)

## 2011-11-24 MED ORDER — FENTANYL CITRATE 0.05 MG/ML IJ SOLN
INTRAMUSCULAR | Status: AC
  Filled 2011-11-24: qty 4

## 2011-11-24 MED ORDER — OXYCODONE HCL 5 MG PO TABS: 5.0000 mg | ORAL_TABLET | ORAL | Status: DC | PRN

## 2011-11-24 MED ORDER — MIDAZOLAM HCL 5 MG/5ML IJ SOLN
INTRAMUSCULAR | Status: AC | PRN
  Administered 2011-11-24 (×2): 1 mg via INTRAVENOUS

## 2011-11-24 MED ORDER — MIDAZOLAM HCL 2 MG/2ML IJ SOLN
INTRAMUSCULAR | Status: AC
  Filled 2011-11-24: qty 4

## 2011-11-24 MED ORDER — FENTANYL CITRATE 0.05 MG/ML IJ SOLN
INTRAMUSCULAR | Status: AC | PRN
  Administered 2011-11-24 (×2): 50 ug via INTRAVENOUS

## 2011-11-24 MED ORDER — SODIUM CHLORIDE 0.9 % IV SOLN: Freq: Once | INTRAVENOUS | Status: DC

## 2011-11-24 NOTE — H&P (Signed)
Carol Palmer is an 71 y.o. female.   Chief Complaint: Hep C HPI: scheduled for liver core biopsy in IR  Past Medical History  Diagnosis Date  . Glaucoma   . Hepatitis     No past surgical history on file.  No family history on file. Social History:  reports that she has never smoked. She has never used smokeless tobacco. She reports that she does not drink alcohol or use illicit drugs.  Allergies: No Known Allergies  Medications Prior to Admission  Medication Sig Dispense Refill  . bimatoprost (LUMIGAN) 0.03 % ophthalmic solution Place 1 drop into the left eye at bedtime.      . dorzolamide (TRUSOPT) 2 % ophthalmic solution Place 1 drop into the left eye 2 (two) times daily.       . pilocarpine (PILOCAR) 2 % ophthalmic solution Place 1 drop into the left eye 4 (four) times daily.      . timolol (BETIMOL) 0.5 % ophthalmic solution Place 1 drop into the left eye 2 (two) times daily.        No current facility-administered medications on file as of 11/24/2011.    No results found for this or any previous visit (from the past 48 hour(s)). No results found.  Review of Systems  Constitutional: Negative for fever.  Eyes:       Glaucoma   Respiratory: Negative for cough.   Cardiovascular: Negative for chest pain.  Gastrointestinal: Negative for nausea and vomiting.  Neurological: Negative for headaches.    Blood pressure 118/71, pulse 60, temperature 97 F (36.1 C), temperature source Oral, resp. rate 18, height 5' 1.5" (1.562 m), weight 138 lb (62.596 kg), SpO2 100.00%. Physical Exam  Constitutional: She is oriented to person, place, and time. She appears well-developed and well-nourished.  HENT:  Head: Normocephalic.  Eyes: EOM are normal.  Neck: Normal range of motion.  Cardiovascular: Normal rate, regular rhythm and normal heart sounds.   No murmur heard. Respiratory: Effort normal and breath sounds normal. She has no wheezes.  GI: Soft. Bowel sounds are normal.  There is no tenderness.  Musculoskeletal: Normal range of motion.  Neurological: She is alert and oriented to person, place, and time.  Skin: Skin is warm.     Assessment/Plan Hepatitis C Scheduled for liver core biopsy in IR Pt aware of procedure benefits and risks and agreeable to proceed Consent signed.  Jaysie Benthall A 11/24/2011, 9:23 AM

## 2011-11-24 NOTE — Procedures (Signed)
Ultrasound guided liver biopsy.  3 core samples.  No immediate complication.

## 2011-11-24 NOTE — Discharge Instructions (Signed)
Liver Biopsy A liver biopsy is done to confirm or prove a suspected problem. The liver is a large organ in the upper right hand of your abdomen. To do the test, the doctor puts a small needle into the right side of your abdomen. A tiny piece of liver tissue is taken and sent for testing. This should not be painful as the skin is injected with a local anesthetic that numbs the area.  HOW A BIOPSY IS PERFORMED This is often performed as a same day surgery. This can be done in a hospital or clinic. Biopsies are often done under local anesthesia which makes the area of biopsy numb. Sometimes sedation is given to help patients relax. If you are taking blood thinning medications or medications containing aspirin, this must be discussed with your caregiver before the test. This medication may need to be stopped for up to 7 days before the procedure, or the dose may need to be changed. You should review all of your other medications with your caregiver before the test. You must remain in bed for 1 to 2 hours after the test. Having something to read may help pass the time.  LET YOUR CAREGIVERS KNOW ABOUT THE FOLLOWING:  Allergies.   Medications taken including herbs, eye drops, over -the- counter medications, and creams.   Use of steroids (by mouth or creams).   Previous problems with anesthetics or novocaine.   Possibility of pregnancy, if this applies.   History of blood clots (thrombophlebitis).   History of bleeding or blood problems.   Previous surgery.   Other health problems.  BEFORE THE PROCEDURE You should be present 60 minutes prior to your procedure or as directed. Check in at the admissions desk for filling out necessary forms if not pre-registered. There will be consent forms to sign prior to the procedure. There is a waiting area for your family while you are having your biopsy. AFTER THE PROCEDURE  After your biopsy, you will be taken to the recovery area where a nurse will watch  and check your progress.   You may have to lie on your right side for 1 to 2 hours.   Your blood pressure and pulse will be checked often.   If you are having pain or feel sick, tell your nurse.   After 1 to 2 hours, if you are going home, you may sit in a chair and get dressed. The nurse will let you know when you can get up.   Once you are doing well, barring other problems, you will be allowed to go home. Once at home, putting an ice pack on your operative site may help with discomfort and keep swelling down.   You may resume a normal diet and activities as directed.   Change dressings as directed.   Only take over-the-counter or prescription medicines for pain, discomfort, or fever as directed by your caregiver.   Call for your results as instructed by your caregiver. Remember it is your job to be sure you get the results of your biopsy and any additional tests performed on the sample taken. Do not assume everything is fine if you do not hear from your caregiver.  HOME CARE INSTRUCTIONS   You should rest for one to two days or as instructed.   You will need to have a responsible adult take you home and stay with you overnight.   Do not lift over 5 lbs. or play contact sports for two weeks.     Do not drive for 24 hours.   Do not take medication containing aspirin or drink alcohol for one week after this test.  SEEK MEDICAL CARE IF:   There is increased bleeding (more than a small spot) from the biopsy site.   You have redness, swelling, or increasing pain in the biopsy site.   You develop swelling or pain in the abdomen.   You have an unexplained oral temperature over 102 F (38.9 C).   You notice a foul smell coming from the wound or dressing.  SEEK IMMEDIATE MEDICAL CARE IF:   You develop a rash.   You have difficulty breathing.   You have allergic problems such as itching or swelling or shortness of breath.  Document Released: 11/14/2003 Document Revised:  08/13/2011 Document Reviewed: 04/03/2008 [employer] Patient Information 2012 Exit[place], [place]iver Biopsy A liver biopsy is done to confirm or prove a suspected problem. The liver is a large organ in the upper right hand of your abdomen. To do the test, the doctor puts a small needle into the right side of your abdomen. A tiny piece of liver tissue is taken and sent for testing. This should not be painful as the skin is injected with a local anesthetic that numbs the area.  HOW A BIOPSY IS PERFORMED This is often performed as a same day surgery. This can be done in a hospital or clinic. Biopsies are often done under local anesthesia which makes the area of biopsy numb. Sometimes sedation is given to help patients relax. If you are taking blood thinning medications or medications containing aspirin, this must be discussed with your caregiver before the test. This medication may need to be stopped for up to 7 days before the procedure, or the dose may need to be changed. You should review all of your other medications with your caregiver before the test. You must remain in bed for 1 to 2 hours after the test. Having something to read may help pass the time.  LET YOUR CAREGIVERS KNOW ABOUT THE FOLLOWING:  Allergies.   Medications taken including herbs, eye drops, over -the- counter medications, and creams.   Use of steroids (by mouth or creams).   Previous problems with anesthetics or novocaine.   Possibility of pregnancy, if this applies.   History of blood clots (thrombophlebitis).   History of bleeding or blood problems.   Previous surgery.   Other health problems.  BEFORE THE PROCEDURE You should be present 60 minutes prior to your procedure or as directed. Check in at the admissions desk for filling out necessary forms if not pre-registered. There will be consent forms to sign prior to the procedure. There is a waiting area for your family while you are having your biopsy. AFTER THE  PROCEDURE  After your biopsy, you will be taken to the recovery area where a nurse will watch and check your progress.   You may have to lie on your right side for 1 to 2 hours.   Your blood pressure and pulse will be checked often.   If you are having pain or feel sick, tell your nurse.   After 1 to 2 hours, if you are going home, you may sit in a chair and get dressed. The nurse will let you know when you can get up.   Once you are doing well, barring other problems, you will be allowed to go home. Once at home, putting an ice pack on your operative site may help with discomfort  and keep swelling down.   You may resume a normal diet and activities as directed.   Change dressings as directed.   Only take over-the-counter or prescription medicines for pain, discomfort, or fever as directed by your caregiver.   Call for your results as instructed by your caregiver. Remember it is your job to be sure you get the results of your biopsy and any additional tests performed on the sample taken. Do not assume everything is fine if you do not hear from your caregiver.  HOME CARE INSTRUCTIONS   You should rest for one to two days or as instructed.   You will need to have a responsible adult take you home and stay with you overnight.   Do not lift over 5 lbs. or play contact sports for two weeks.   Do not drive for 24 hours.   Do not take medication containing aspirin or drink alcohol for one week after this test.  SEEK MEDICAL CARE IF:   There is increased bleeding (more than a small spot) from the biopsy site.   You have redness, swelling, or increasing pain in the biopsy site.   You develop swelling or pain in the abdomen.   You have an unexplained oral temperature over 102 F (38.9 C).   You notice a foul smell coming from the wound or dressing.  SEEK IMMEDIATE MEDICAL CARE IF:   You develop a rash.   You have difficulty breathing.   You have allergic problems such as  itching or swelling or shortness of breath.  Document Released: 11/14/2003 Document Revised: 08/13/2011 Document Reviewed: 04/03/2008 [facility] Patient Information 2012 [place], [place].Marland Kitchen

## 2011-11-26 ENCOUNTER — Telehealth (HOSPITAL_COMMUNITY): Payer: Self-pay

## 2011-12-31 ENCOUNTER — Ambulatory Visit (INDEPENDENT_AMBULATORY_CARE_PROVIDER_SITE_OTHER): Payer: Medicare Other | Admitting: Gastroenterology

## 2011-12-31 DIAGNOSIS — B182 Chronic viral hepatitis C: Secondary | ICD-10-CM

## 2011-12-31 NOTE — Patient Instructions (Signed)
1. We reviewed the results of the biopsy today.  There are no concerning findings and the scarring is so minimal that treatment right now is not needed. 2. The liver community would generally wait until next year when there will be all oral medications with better side effect profiles and improved response rates.  So will see you in a year.

## 2012-01-07 NOTE — Progress Notes (Signed)
NAMECALEESI, KOHL    MR#:  [id number]      DATE:  12/31/2011  DOB:  1941-04-11    cc: Primary Care Physician: Bebe Liter, FNP-C, c/o Dorothyann Peng, MD, [facility], [address], [place], [place] [address], [place] [phone]  Referring Physician: Pollyann Savoy, MD, [address] [place], [place], [place] [address], Fax [phone]    REASON FOR VISIT:  Follow up of genotype 1a hepatitis C.   HISTORY:  The patient returns today unaccompanied, this is despite the fact I told that she should bring family with her to her appointment.    She has no symptoms referable to her history of hepatitis C. There are no symptoms to suggest cryoglobulin mediated or decompensated liver disease.   PAST MEDICAL HISTORY:  It should be noted that on 08/24/2011, she was seen in Infectious Disease for delusional parasitosis. 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   CURRENT MEDICATIONS:  1. Pilocarpine 2% one drop in the left eye 4 times a day. 2. Dorzolamide HCL 2% 1 drop left eye q.12 hours. 3. Timolol 0.5% 1 drop left eye q.12  4. Lumigan 0.03% 1 drop left eye daily. 5. Olive leaf extract 150 mg daily.  6. Garlic 1000 mg daily.   ALLERGIES:  Denies.   HABITS:  Smoking never. Alcohol denies interval consumption.   REVIEW OF SYSTEMS:  All 10 systems reviewed today with the patient and they are negative other than which was mentioned above. CES-D was zero.   PHYSICAL EXAMINATION:  Constitutional: Appeared stated age without significant peripheral wasting. Vital signs: Height 61 inches, weight 150 pounds up 4 pounds from previous. Blood pressure 145/92, pulse 59, temperature 96.9 Fahrenheit.   LABORATORIES:  Her liver biopsy from 11/23/2001, showed a modified HAI of 6/18 with a fibrosis score 1/6, which translates into a fibrous expansion of some portal areas without bridging or septate  formation. Her PAS and iron stains were negative. There was no steatosis. This fibrosis score was thought to represent a stage I/IV.   ASSESSMENT:  The patient is a 71 year old woman with history of genotype 1a hepatitis C with a liver biopsy on 11/24/2011, showing effectively stage I fibrosis on the Ludwig-Batts scoring system. Given her age, I do not think she would be a good candidate for combination triple therapy with interferon. Given the lack of fibrosis, there is no urgency to treat her. 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 The sum total of all this, makes me concerned about her candidacy for interferon and protease inhibitor based therapies. It is quite possible that her mortality and morbidity may not be determined by her liver disease.   In my discussion today with the patient, we discussed her biopsy findings and the implication that she does not need treatment. She was somewhat disappointed to hear that she did not need to be treated. She had no explanation as to why she could not bring her daughter today, and stated that she could bring her if needed. I pointed out to her, I have explained to her in the past, that she needed to bring her daughter to appointments.   I reviewed with her current therapy versus waiting for a noninterferon based therapy should she be eligible for this in the future. I suggested she wait.   We  also discussed the risk of contagion, because she told that her daughter was somewhat concerned about letting her look after her grandchildren. I explained there is no risk of contagion provided there is no exposure to her blood.   PLAN:  1. Hepatitis A and B immune.  2. Return in a year's time.  3. I have given her literature on her liver biopsy and her genotype.               Brooke Dare, MD   [phone]  D:  Thu Apr 25 17:41:49 2013 ; T:  Thu Apr 25  21:58:01 2013  Job #:  [id number]

## 2012-02-24 ENCOUNTER — Emergency Department (HOSPITAL_COMMUNITY): Payer: Medicare Other

## 2012-02-24 ENCOUNTER — Encounter (HOSPITAL_COMMUNITY): Payer: Self-pay | Admitting: Emergency Medicine

## 2012-02-24 ENCOUNTER — Emergency Department (HOSPITAL_COMMUNITY)
Admission: EM | Admit: 2012-02-24 | Discharge: 2012-02-24 | Disposition: A | Discharge status: Home / Self Care | Payer: Medicare Other | Attending: Emergency Medicine | Admitting: Emergency Medicine

## 2012-02-24 DIAGNOSIS — H409 Unspecified glaucoma: Secondary | ICD-10-CM | POA: Insufficient documentation

## 2012-02-24 DIAGNOSIS — M069 Rheumatoid arthritis, unspecified: Secondary | ICD-10-CM | POA: Insufficient documentation

## 2012-02-24 DIAGNOSIS — L02619 Cutaneous abscess of unspecified foot: Secondary | ICD-10-CM | POA: Insufficient documentation

## 2012-02-24 DIAGNOSIS — L0390 Cellulitis, unspecified: Secondary | ICD-10-CM

## 2012-02-24 HISTORY — DX: Rheumatoid arthritis, unspecified: M06.9

## 2012-02-24 HISTORY — DX: Otitis media, unspecified, unspecified ear: H66.90

## 2012-02-24 HISTORY — DX: Unspecified hearing loss, unspecified ear: H91.90

## 2012-02-24 MED ORDER — ACETAMINOPHEN 500 MG PO TABS
500.0000 mg | ORAL_TABLET | Freq: Once | ORAL | Status: DC
  Filled 2012-02-24: qty 1

## 2012-02-24 MED ORDER — CLINDAMYCIN HCL 300 MG PO CAPS: 300.0000 mg | ORAL_CAPSULE | Freq: Three times a day (TID) | ORAL | Status: AC

## 2012-02-24 MED ORDER — ACETAMINOPHEN 325 MG PO TABS
650.0000 mg | ORAL_TABLET | Freq: Once | ORAL | Status: AC
  Administered 2012-02-24: 650 mg via ORAL

## 2012-02-24 MED ORDER — ACETAMINOPHEN 325 MG PO TABS
ORAL_TABLET | ORAL | Status: AC
  Filled 2012-02-24: qty 2

## 2012-02-24 MED ORDER — CLINDAMYCIN HCL 300 MG PO CAPS
300.0000 mg | ORAL_CAPSULE | Freq: Once | ORAL | Status: AC
  Administered 2012-02-24: 300 mg via ORAL
  Filled 2012-02-24 (×2): qty 1

## 2012-02-24 MED ORDER — ACETAMINOPHEN 500 MG PO TABS: 500.0000 mg | ORAL_TABLET | Freq: Four times a day (QID) | ORAL | Status: AC | PRN

## 2012-02-24 NOTE — ED Provider Notes (Signed)
History   This chart was scribed for Gerhard Munch, MD by Shari Heritage. The patient was seen in room TR08C/TR08C. Patient's care was started at 1133.     CSN: [id number]  Arrival date & time 02/24/12  1133   First MD Initiated Contact with Patient 02/24/12 1313      Chief Complaint  Patient presents with  . Foot Pain    left foot    HPI Carol Palmer is a 71 y.o. female who presents to the Emergency Department complaining of an insect bite on the plantar surface of her left foot with associated pain, itchiness and tingling. Patient describes the pain in her foot as moderate to severe. Patient says that she also feels pain in her abdomen and left right. Patient says that she did not actually see anything bite her, but she felt a sharp sensation prior to the symptoms. Patient denies fever, vomiting, chest pain, SOB. Patient tried submerging her entire left foot in alcohol, but it did not relief her symptoms. Patient with h/o glaucoma and rheumatoid arthritis. Patient with surgical h/o inner ear surgery and eye surgery.  Past Medical History  Diagnosis Date  . Glaucoma   . Hepatitis   . Ear infection   . Hard of hearing   . Rheumatoid arthritis     Past Surgical History  Procedure Date  . Inner ear surgery   . Eye surgery     History reviewed. No pertinent family history.  History  Substance Use Topics  . Smoking status: Never Smoker   . Smokeless tobacco: Never Used  . Alcohol Use: No    OB History    Grav Para Term Preterm Abortions TAB SAB Ect Mult Living                  Review of Systems  Constitutional:       Per HPI, otherwise negative  HENT:       Per HPI, otherwise negative  Eyes: Negative.   Respiratory:       Per HPI, otherwise negative  Cardiovascular:       Per HPI, otherwise negative  Gastrointestinal: Negative for vomiting.  Genitourinary: Negative.   Musculoskeletal:       Per HPI, otherwise negative  Skin: Negative.   Neurological:  Negative for syncope.    Allergies  Review of patient's allergies indicates no known allergies.  Home Medications   Current Outpatient Rx  Name Route Sig Dispense Refill  . BIMATOPROST 0.03 % OP SOLN Left Eye Place 1 drop into the left eye at bedtime.    . DORZOLAMIDE HCL 2 % OP SOLN Left Eye Place 1 drop into the left eye 2 (two) times daily.     Marland Kitchen PILOCARPINE HCL 2 % OP SOLN Left Eye Place 1 drop into the left eye 4 (four) times daily.    Marland Kitchen TIMOLOL HEMIHYDRATE 0.5 % OP SOLN Left Eye Place 1 drop into the left eye 2 (two) times daily.       BP 171/78  Pulse 65  Temp 98.4 F (36.9 C) (Oral)  Resp 16  SpO2 98%  Physical Exam  Nursing note and vitals reviewed. Constitutional: She is oriented to person, place, and time. She appears well-developed and well-nourished. No distress.  HENT:  Head: Normocephalic and atraumatic.  Eyes: Conjunctivae and EOM are normal.  Cardiovascular: Normal rate and regular rhythm.   Pulmonary/Chest: Effort normal and breath sounds normal. No stridor. No respiratory distress.  Abdominal: She exhibits  no distension.  Musculoskeletal: She exhibits no edema.       Diffuse area that is slightly edematous, mildly erythematous around the lateral malleolus of left foot.  Neurological: She is alert and oriented to person, place, and time. No cranial nerve deficit.  Skin: Skin is warm and dry.  Psychiatric: She has a normal mood and affect.    ED Course  Procedures (including critical care time) DIAGNOSTIC STUDIES:   COORDINATION OF CARE: 1:59PM- Patient informed of current plan for treatment and evaluation and agrees with plan at this time. Will prescribe pain medication and antibiotics.  2:53PM- Updated patient on her case. Informed patient that X-ray indicated presence of foreign bodies in the foot. Performed a re-examination of the foot and showed patient X-ray images. Will treat patient for a skin infection. Informed patient that she should be  further evaluated by an orthopedist.  Dg Foot Complete Left  02/24/2012  *RADIOLOGY REPORT*  Clinical Data: Left foot pain and swelling  LEFT FOOT - COMPLETE 3+ VIEW  Comparison: None.  Findings: No acute fracture.  No dislocation.  Degenerative changes at the first metatarsophalangeal joint are present with overlying soft tissue swelling. Curvilinear radio opaque densities are seen in the soft tissues of the dorsal forefoot of unknown significance.  IMPRESSION: No acute bony pathology.  Radiopaque foreign bodies in the dorsal soft tissues may be present.  Original Report Authenticated By: Donavan Burnet, M.D.   Following return of the x-ray images, a repeat physical exam was conducted.  There is no tenderness to palpation about the questionable opacity on the film.  There is also no puncture mark, no erythema, no edema.  The patient denies any recollection of any trauma to that area.  She was made aware of the necessity to follow up with orthopedics for further evaluation of this as well as her ongoing cellulitis.  No diagnosis found.    MDM  I personally performed the services described in this documentation, which was scribed in my presence. The recorded information has been reviewed and considered.  This 71 year old female presents with concerns over new left ankle and foot pain.  On exam the patient is in no distress, though she is tenderness diffusely about the ankle and foot.  Number, there is erythema and edema present in this area, as well as dry, cake skin on the underside of her foot.  The patient's physical exam is most consistent with cellulitis.  Absent vital sign abnormalities, fever, evidence of distress there is low suspicion for ongoing systemic infection.  The patient's x-ray did not demonstrate fracture, but there was a questionable opacity identified.  A repeat evaluation did not demonstrate any notable physical exam findings about his questionable opacity, though it was made clear  to the patient that this warranted further evaluation.  It was also anchored to the patient that given her cellulitis, she needed close followup, and a wound check in 2 days for resolution of her symptoms.  Absent abnormal vital signs, distress, systemic infection, the patient was discharged in stable condition with explicit return precautions and followup instructions.  Gerhard Munch, MD 02/25/12 1153

## 2012-02-24 NOTE — ED Notes (Signed)
Pt helped to the restroom via chair, went to check on pt and pt noted to washing hair standing on her right foot. Pt instructed to stop and let me help her back into chair due to risk of fall, pt continues to wash hair

## 2012-02-24 NOTE — Discharge Instructions (Signed)
Cellulitis Cellulitis is an infection of the tissue under the skin. The infected area is usually red and tender. This is caused by germs. These germs enter the body through cuts or sores. This usually happens in the arms or lower legs. HOME CARE   Take your medicine as told. Finish it even if you start to feel better.   If the infection is on the arm or leg, keep it raised (elevated).   Use a warm cloth on the infected area several times a day.   See your doctor for a follow-up visit as told.  GET HELP RIGHT AWAY IF:   You are tired or confused.   You throw up (vomit).   You have watery poop (diarrhea).   You feel ill and have muscle aches.   You have a fever.  MAKE SURE YOU:   Understand these instructions.   Will watch your condition.   Will get help right away if you are not doing well or get worse.  Document Released: 02/10/2008 Document Revised: 08/13/2011 Document Reviewed: 07/26/2009 [facility] Patient Information 2012 [place], [place].Cellulitis Cellulitis is an infection of the tissue under the skin. The infected area is usually red and tender. This is caused by germs. These germs enter the body through cuts or sores. This usually happens in the arms or lower legs. HOME CARE   Take your medicine as told. Finish it even if you start to feel better.   If the infection is on the arm or leg, keep it raised (elevated).   Use a warm cloth on the infected area several times a day.   See your doctor for a follow-up visit as told.  GET HELP RIGHT AWAY IF:   You are tired or confused.   You throw up (vomit).   You have watery poop (diarrhea).   You feel ill and have muscle aches.   You have a fever.  MAKE SURE YOU:   Understand these instructions.   Will watch your condition.   Will get help right away if you are not doing well or get worse.  Document Released: 02/10/2008 Document Revised: 08/13/2011 Document Reviewed: 07/26/2009 [facility] Patient Information  2012 [place], [place].

## 2012-02-24 NOTE — ED Notes (Signed)
Pt helped back into chair and pt repositioned, foot elevated

## 2012-02-24 NOTE — ED Notes (Signed)
Dr Jeraldine Loots at bedside, pt okayed to have something to eat to go with medication. Pt had tylenol PTA.

## 2012-02-24 NOTE — ED Notes (Signed)
Pt c/o left foot pain. Pt felt pinprick on bottom of left foot last night. Visitor reports pt had multiple socks on foot overnight and may have been like a tourniquet. Pt had appointment with orthopedic doctor today previously sceduled and was told that its not an orthopedic problem. Visitor reports pt given 2 extra strength tylenol by orthopedic Doctor today and that the swelling in left foot has decreased since taking the socks off. Pt did not sleep last night or eat this morning.

## 2012-05-30 ENCOUNTER — Other Ambulatory Visit: Payer: Self-pay

## 2012-05-30 DIAGNOSIS — M7989 Other specified soft tissue disorders: Secondary | ICD-10-CM

## 2012-06-03 ENCOUNTER — Encounter (HOSPITAL_COMMUNITY): Payer: Self-pay | Admitting: Family Medicine

## 2012-06-03 ENCOUNTER — Inpatient Hospital Stay (HOSPITAL_COMMUNITY)
Admission: EM | Admit: 2012-06-03 | Discharge: 2012-06-07 | DRG: 603 | Disposition: A | Discharge status: Home / Self Care | Payer: Medicare Other | Attending: Internal Medicine | Admitting: Internal Medicine

## 2012-06-03 ENCOUNTER — Emergency Department (HOSPITAL_COMMUNITY): Payer: Medicare Other

## 2012-06-03 DIAGNOSIS — M069 Rheumatoid arthritis, unspecified: Secondary | ICD-10-CM | POA: Diagnosis present

## 2012-06-03 DIAGNOSIS — Z79899 Other long term (current) drug therapy: Secondary | ICD-10-CM

## 2012-06-03 DIAGNOSIS — Z23 Encounter for immunization: Secondary | ICD-10-CM

## 2012-06-03 DIAGNOSIS — D649 Anemia, unspecified: Secondary | ICD-10-CM

## 2012-06-03 DIAGNOSIS — L03119 Cellulitis of unspecified part of limb: Principal | ICD-10-CM | POA: Diagnosis present

## 2012-06-03 DIAGNOSIS — B1710 Acute hepatitis C without hepatic coma: Secondary | ICD-10-CM | POA: Diagnosis present

## 2012-06-03 DIAGNOSIS — R64 Cachexia: Secondary | ICD-10-CM | POA: Diagnosis present

## 2012-06-03 DIAGNOSIS — K759 Inflammatory liver disease, unspecified: Secondary | ICD-10-CM

## 2012-06-03 DIAGNOSIS — H60399 Other infective otitis externa, unspecified ear: Secondary | ICD-10-CM | POA: Diagnosis present

## 2012-06-03 DIAGNOSIS — F22 Delusional disorders: Secondary | ICD-10-CM | POA: Diagnosis present

## 2012-06-03 DIAGNOSIS — L02419 Cutaneous abscess of limb, unspecified: Principal | ICD-10-CM | POA: Diagnosis present

## 2012-06-03 DIAGNOSIS — B1920 Unspecified viral hepatitis C without hepatic coma: Secondary | ICD-10-CM | POA: Diagnosis present

## 2012-06-03 DIAGNOSIS — Z8659 Personal history of other mental and behavioral disorders: Secondary | ICD-10-CM

## 2012-06-03 DIAGNOSIS — H409 Unspecified glaucoma: Secondary | ICD-10-CM | POA: Diagnosis present

## 2012-06-03 DIAGNOSIS — E119 Type 2 diabetes mellitus without complications: Secondary | ICD-10-CM | POA: Diagnosis present

## 2012-06-03 LAB — BASIC METABOLIC PANEL
BUN: 10 mg/dL (ref 6–23)
GFR calc Af Amer: >90 mL/min (ref >90)
GFR calc non Af Amer: 88 mL/min — ABNORMAL LOW (ref >90)
Potassium: 3.8 mEq/L (ref 3.5–5.1)
Sodium: 141 mEq/L (ref 135–145)

## 2012-06-03 LAB — CBC WITH DIFFERENTIAL/PLATELET
Basophils Absolute: 0 10*3/uL (ref 0.0–0.1)
Eosinophils Absolute: 0.1 10*3/uL (ref 0.0–0.7)
Lymphocytes Relative: 49 % — ABNORMAL HIGH (ref 12–46)
MCHC: 31.4 g/dL (ref 30.0–36.0)
Neutrophils Relative %: 41 % — ABNORMAL LOW (ref 43–77)
RDW: 13.9 % (ref 11.5–15.5)
WBC: 5.1 10*3/uL (ref 4.0–10.5)

## 2012-06-03 MED ORDER — LIDOCAINE HCL (PF) 1 % IJ SOLN
5.0000 mL | Freq: Once | INTRAMUSCULAR | Status: AC
  Administered 2012-06-04: 5 mL via INTRADERMAL
  Filled 2012-06-03: qty 5

## 2012-06-03 MED ORDER — SODIUM CHLORIDE 0.9 % IV SOLN
Freq: Once | INTRAVENOUS | Status: AC
  Administered 2012-06-04: 02:00:00 via INTRAVENOUS

## 2012-06-03 NOTE — ED Notes (Signed)
Patient C/O her feet being swollen.  States that her feet have been swelling for 8 weeks. C/O pain in her feet and ears. States that her ears have been hurting for 4 weeks. There is a nodule noted behind her right ear. States that she feels like there is something in her left ear.

## 2012-06-03 NOTE — ED Notes (Signed)
No answer in waiting room 

## 2012-06-03 NOTE — ED Notes (Signed)
No answer in waiting  Room x 3.

## 2012-06-03 NOTE — ED Notes (Signed)
Pt came up to nurse first to ask about wait time.  States she did not hear when called, has trouble hearing.

## 2012-06-03 NOTE — ED Notes (Signed)
sts BLE edema x 8 weeks. BP elevated. sts also knot behind ear that has been there for 4 days.

## 2012-06-03 NOTE — ED Provider Notes (Signed)
History     CSN: [id number]  Arrival date & time 06/03/12  1636   First MD Initiated Contact with Patient 06/03/12 2138      Chief Complaint  Patient presents with  . Leg Swelling  . Leg Pain    (Consider location/radiation/quality/duration/timing/severity/associated sxs/prior treatment) HPI  71 y.o. female who lives independently complaining of left leg swelling worsening over the course of 6 weeks. Denies any trauma, history of DVT, shortness of breath. Patient states her right leg was swollen 8 weeks ago and this came from an "infection in her toe and spread to the left leg." She denies any fever, nausea/vomiting. Patient is also complaining of a painful swelling to the posterior left ear.  Past Medical History  Diagnosis Date  . Glaucoma   . Hepatitis   . Ear infection   . Hard of hearing   . Rheumatoid arthritis     Past Surgical History  Procedure Date  . Inner ear surgery   . Eye surgery     No family history on file.  History  Substance Use Topics  . Smoking status: Never Smoker   . Smokeless tobacco: Never Used  . Alcohol Use: No    OB History    Grav Para Term Preterm Abortions TAB SAB Ect Mult Living                  Review of Systems  Constitutional: Negative for fever.  Respiratory: Negative for shortness of breath.   Cardiovascular: Positive for leg swelling. Negative for chest pain.  Gastrointestinal: Negative for nausea, vomiting, abdominal pain and diarrhea.  Skin: Positive for rash.  All other systems reviewed and are negative.    Allergies  Review of patient's allergies indicates no known allergies.  Home Medications   Current Outpatient Rx  Name Route Sig Dispense Refill  . BIMATOPROST 0.03 % OP SOLN Left Eye Place 1 drop into the left eye at bedtime.    . DORZOLAMIDE HCL 2 % OP SOLN Left Eye Place 1 drop into the left eye 2 (two) times daily.     . ADULT MULTIVITAMIN W/MINERALS CH Oral Take 1 tablet by mouth daily.    Marland Kitchen  PILOCARPINE HCL 2 % OP SOLN Left Eye Place 1 drop into the left eye 4 (four) times daily.    Marland Kitchen TIMOLOL HEMIHYDRATE 0.5 % OP SOLN Left Eye Place 1 drop into the left eye 2 (two) times daily.       BP 146/76  Pulse 72  Temp 98.5 F (36.9 C) (Oral)  Resp 16  SpO2 100%  Physical Exam  Nursing note and vitals reviewed. Constitutional: She is oriented to person, place, and time. She appears well-developed and well-nourished. No distress.       Frail, very hard of hearing.   HENT:  Head: Normocephalic and atraumatic.  Mouth/Throat: Oropharynx is clear and moist.       Bilateral tympanic membrane rupture, no active purulent discharge. No tenderness to palpation of the mastoid bilaterally  Eyes: Conjunctivae normal and EOM are normal. Pupils are equal, round, and reactive to light.  Neck: Normal range of motion. Neck supple. No JVD present.  Cardiovascular: Normal rate, regular rhythm and intact distal pulses.   Pulmonary/Chest: Effort normal and breath sounds normal. No stridor.  Abdominal: Soft. Bowel sounds are normal. She exhibits no distension and no mass. There is no tenderness. There is no rebound and no guarding.  Musculoskeletal: Normal range of motion.  Neurological: She  is alert and oriented to person, place, and time.  Skin: Skin is warm.       Right calf measures 29 cm left calf is 33 cm with mild tenderness to palpation and no superficial collateral veins.   Left ankle shows pitting edema with erythema, and induration, warmth and tenderness to palpation.  Dorsalis pedis 2+ bilaterally.  3 CM fluctuant abscess to posterior left ear.  Psychiatric: She has a normal mood and affect.    ED Course  Procedures (including critical care time)  INCISION AND DRAINAGE Performed by: Wynetta Emery Consent: Verbal consent obtained. Risks and benefits: risks, benefits and alternatives were discussed Type: abscess  Body area: left posterior ear   Anesthesia: local  infiltration  Local anesthetic: lidocaine 2% without epinephrine  Anesthetic total: 4 ml  Complexity: complex Blunt dissection to break up loculations  Drainage: purulent  Drainage amount 6mL  Packing material: None  Patient tolerance: Patient tolerated the procedure well with no immediate complications.    Labs Reviewed  CBC WITH DIFFERENTIAL - Abnormal; Notable for the following:    Hemoglobin 11.4 (*)     MCV 72.3 (*)     MCH 22.7 (*)     Neutrophils Relative 41 (*)     Lymphocytes Relative 49 (*)     All other components within normal limits  BASIC METABOLIC PANEL - Abnormal; Notable for the following:    GFR calc non Af Amer 88 (*)     All other components within normal limits  URINALYSIS, ROUTINE W REFLEX MICROSCOPIC   Dg Ankle Complete Left  06/04/2012  *RADIOLOGY REPORT*  Clinical Data: Left ankle and leg swelling.  LEFT ANKLE COMPLETE - 3+ VIEW  Comparison: 07/04/2005  Findings: Three views of the left ankle are negative for acute fracture or dislocation.  Suspect degenerative changes at the first MTP joint.  IMPRESSION: No acute bony abnormality in the left ankle.   Original Report Authenticated By: Richarda Overlie, M.D.      1. Cellulitis and abscess of leg       MDM  Cellulitis to the left lower extremity. This will require IV antibiotics as this is extensive and ankles the ankle to mid shin. Also questionable DVT she will need venous duplex in the a.m.  I and D to posterior left ear performed, wound Cx sent.   She will be admitted to med/surg bed under Dr. Lovell Sheehan [facility].        Joni Reining Jonuel Butterfield, PA-C 06/04/12 0200

## 2012-06-04 ENCOUNTER — Observation Stay (HOSPITAL_COMMUNITY): Payer: Medicare Other

## 2012-06-04 DIAGNOSIS — M79609 Pain in unspecified limb: Secondary | ICD-10-CM

## 2012-06-04 DIAGNOSIS — L02419 Cutaneous abscess of limb, unspecified: Secondary | ICD-10-CM | POA: Diagnosis present

## 2012-06-04 DIAGNOSIS — M7989 Other specified soft tissue disorders: Secondary | ICD-10-CM

## 2012-06-04 DIAGNOSIS — R6889 Other general symptoms and signs: Secondary | ICD-10-CM

## 2012-06-04 LAB — CBC
Hemoglobin: 10.7 g/dL — ABNORMAL LOW (ref 12.0–15.0)
MCH: 22.2 pg — ABNORMAL LOW (ref 26.0–34.0)
RBC: 4.82 MIL/uL (ref 3.87–5.11)
WBC: 5.2 10*3/uL (ref 4.0–10.5)

## 2012-06-04 LAB — URINALYSIS, ROUTINE W REFLEX MICROSCOPIC
Bilirubin Urine: NEGATIVE
Nitrite: NEGATIVE
Specific Gravity, Urine: 1.008 (ref 1.005–1.030)
Urobilinogen, UA: 0.2 mg/dL (ref 0.0–1.0)

## 2012-06-04 LAB — URINE MICROSCOPIC-ADD ON

## 2012-06-04 LAB — BASIC METABOLIC PANEL
CO2: 27 mEq/L (ref 19–32)
GFR calc non Af Amer: 88 mL/min — ABNORMAL LOW (ref >90)
Glucose, Bld: 94 mg/dL (ref 70–99)
Potassium: 3.3 mEq/L — ABNORMAL LOW (ref 3.5–5.1)
Sodium: 141 mEq/L (ref 135–145)

## 2012-06-04 MED ORDER — OXYCODONE HCL 5 MG PO TABS
5.0000 mg | ORAL_TABLET | ORAL | Status: DC | PRN
  Administered 2012-06-04 – 2012-06-06 (×2): 5 mg via ORAL
  Filled 2012-06-04 (×2): qty 1

## 2012-06-04 MED ORDER — HYDROMORPHONE HCL PF 1 MG/ML IJ SOLN
0.5000 mg | INTRAMUSCULAR | Status: DC | PRN
  Administered 2012-06-05: 0.5 mg via INTRAVENOUS
  Administered 2012-06-05 – 2012-06-06 (×2): 1 mg via INTRAVENOUS
  Filled 2012-06-04 (×3): qty 1

## 2012-06-04 MED ORDER — IOHEXOL 350 MG/ML SOLN
80.0000 mL | Freq: Once | INTRAVENOUS | Status: AC | PRN
  Administered 2012-06-04: 80 mL via INTRAVENOUS

## 2012-06-04 MED ORDER — ACETAMINOPHEN 325 MG PO TABS
650.0000 mg | ORAL_TABLET | Freq: Four times a day (QID) | ORAL | Status: DC | PRN
  Administered 2012-06-05 – 2012-06-07 (×2): 650 mg via ORAL
  Filled 2012-06-04 (×2): qty 2

## 2012-06-04 MED ORDER — SODIUM CHLORIDE 0.9 % IV SOLN
INTRAVENOUS | Status: DC
  Administered 2012-06-04 – 2012-06-06 (×4): via INTRAVENOUS
  Administered 2012-06-06: 1000 mL via INTRAVENOUS

## 2012-06-04 MED ORDER — INFLUENZA VIRUS VACC SPLIT PF IM SUSP
0.5000 mL | INTRAMUSCULAR | Status: AC
  Administered 2012-06-05: 0.5 mL via INTRAMUSCULAR
  Filled 2012-06-04: qty 0.5

## 2012-06-04 MED ORDER — HYDROMORPHONE HCL PF 1 MG/ML IJ SOLN: 1.0000 mg | INTRAMUSCULAR | Status: DC | PRN

## 2012-06-04 MED ORDER — PNEUMOCOCCAL VAC POLYVALENT 25 MCG/0.5ML IJ INJ
0.5000 mL | INJECTION | INTRAMUSCULAR | Status: AC
  Administered 2012-06-05: 0.5 mL via INTRAMUSCULAR
  Filled 2012-06-04: qty 0.5

## 2012-06-04 MED ORDER — VANCOMYCIN HCL IN DEXTROSE 1-5 GM/200ML-% IV SOLN
1000.0000 mg | Freq: Once | INTRAVENOUS | Status: AC
  Administered 2012-06-04: 1000 mg via INTRAVENOUS
  Filled 2012-06-04: qty 200

## 2012-06-04 MED ORDER — ALUM & MAG HYDROXIDE-SIMETH 200-200-20 MG/5ML PO SUSP
30.0000 mL | Freq: Four times a day (QID) | ORAL | Status: DC | PRN
  Filled 2012-06-04: qty 30

## 2012-06-04 MED ORDER — ONDANSETRON HCL 4 MG/2ML IJ SOLN: 4.0000 mg | Freq: Four times a day (QID) | INTRAMUSCULAR | Status: DC | PRN

## 2012-06-04 MED ORDER — VANCOMYCIN HCL 1000 MG IV SOLR
1250.0000 mg | Freq: Two times a day (BID) | INTRAVENOUS | Status: DC
  Administered 2012-06-04 – 2012-06-05 (×2): 1250 mg via INTRAVENOUS
  Filled 2012-06-04 (×3): qty 1250

## 2012-06-04 MED ORDER — SODIUM CHLORIDE 0.9 % IV SOLN: INTRAVENOUS | Status: AC

## 2012-06-04 MED ORDER — ENOXAPARIN SODIUM 40 MG/0.4ML ~~LOC~~ SOLN
40.0000 mg | SUBCUTANEOUS | Status: DC
  Administered 2012-06-04 – 2012-06-06 (×3): 40 mg via SUBCUTANEOUS
  Filled 2012-06-04 (×6): qty 0.4

## 2012-06-04 MED ORDER — ZOLPIDEM TARTRATE 5 MG PO TABS: 5.0000 mg | ORAL_TABLET | Freq: Every evening | ORAL | Status: DC | PRN

## 2012-06-04 MED ORDER — ONDANSETRON HCL 4 MG PO TABS: 4.0000 mg | ORAL_TABLET | Freq: Four times a day (QID) | ORAL | Status: DC | PRN

## 2012-06-04 MED ORDER — ACETAMINOPHEN 650 MG RE SUPP: 650.0000 mg | Freq: Four times a day (QID) | RECTAL | Status: DC | PRN

## 2012-06-04 MED ORDER — ONDANSETRON HCL 4 MG/2ML IJ SOLN: 4.0000 mg | Freq: Three times a day (TID) | INTRAMUSCULAR | Status: DC | PRN

## 2012-06-04 NOTE — H&P (Signed)
[facility] History and Physical  Jin Shockley YQM:[id number] DOB: 25-Apr-1941 DOA: 06/03/2012  Referring physician: EDP PCP: Carol Liter, NP   Chief Complaint: Increased Swelling and Pain in Left leg  HPI: Carol Palmer is a 71 y.o. female who presents to the ED with complaints of increased swelling and worsening pain of the Left lower leg x 6 weeks.  She denies having any injuries.  She denies having fevers or chills or chest pain or SOB.  She was evaluated in the ED and was placed on IV Vancomycin for Cellulitis, however there was concern for a possible DVT.  She was referred for medical admission.     Review of Systems: The patient denies anorexia, fever, weight loss, vision loss, decreased hearing, hoarseness, chest pain, syncope, dyspnea on exertion, peripheral edema, balance deficits, hemoptysis, abdominal pain, melena, hematochezia, severe indigestion/heartburn, hematuria, incontinence, genital sores, muscle weakness, suspicious skin lesions, transient blindness, difficulty walking, depression, unusual weight change, abnormal bleeding, enlarged lymph nodes, angioedema, and breast masses.    Past Medical History  Diagnosis Date  . Glaucoma   . Hepatitis   . Ear infection   . Hard of hearing   . Rheumatoid arthritis    Past Surgical History  Procedure Date  . Inner ear surgery   . Eye surgery     Medications:  HOME MEDS: Prior to Admission medications   Medication Sig Start Date End Date Taking? Authorizing Provider  bimatoprost (LUMIGAN) 0.03 % ophthalmic solution Place 1 drop into the left eye at bedtime.   Yes Historical Provider, MD  dorzolamide (TRUSOPT) 2 % ophthalmic solution Place 1 drop into the left eye 2 (two) times daily.    Yes Historical Provider, MD  Multiple Vitamin (MULTIVITAMIN WITH MINERALS) TABS Take 1 tablet by mouth daily.   Yes Historical Provider, MD  pilocarpine (PILOCAR) 2 % ophthalmic solution Place 1 drop into the left eye 4 (four)  times daily.   Yes Historical Provider, MD  timolol (BETIMOL) 0.5 % ophthalmic solution Place 1 drop into the left eye 2 (two) times daily.    Yes Historical Provider, MD    No Known Allergies   Social History:  reports that she has never smoked. She has never used smokeless tobacco. She reports that she does not drink alcohol or use illicit drugs.    No family history on file.     Physical Exam:  GEN:  Pleasant  Elderly Cachectic 71 year old African American Female examined  and in no acute distress; cooperative with exam Filed Vitals:   06/03/12 2106  BP: 146/76  Pulse: 72  Temp: 98.5 F (36.9 C)  TempSrc: Oral  Resp: 16  SpO2: 100%   Blood pressure 146/76, pulse 72, temperature 98.5 F (36.9 C), temperature source Oral, resp. rate 16, SpO2 100.00%. PSYCH: She is alert and oriented x4; does not appear anxious does not appear depressed; affect is normal HEENT: Normocephalic and Atraumatic, + Pterygium on upper pole of Right Globe,  Mucous membranes pink; PERRLA; EOM intact; Fundi:  Benign;  No scleral icterus, Nares: Patent, Oropharynx: Clear, Edentulous, Neck:  FROM, no cervical lymphadenopathy nor thyromegaly or carotid bruit; no JVD; Breasts:: Not examined CHEST WALL: No tenderness CHEST: Normal respiration, clear to auscultation bilaterally HEART: Regular rate and rhythm; no murmurs rubs or gallops BACK: No kyphosis or scoliosis; no CVA tenderness ABDOMEN: Positive Bowel Sounds, Scaphoid, soft non-tender; no masses, no organomegaly, . Rectal Exam: Not done EXTREMITIES: 2+ EDEMA LLE, tender to palpation, No palpable cords  of LLE.  No cyanosis, clubbing or edema of RLE; no ulcerations. Genitalia: not examined PULSES: 2+ and symmetric SKIN: Normal hydration no rash or ulceration CNS: Cranial nerves 2-12 grossly intact no focal neurologic deficit   Labs on Admission:  Basic Metabolic Panel:  Lab 06/03/12 1610  NA 141  K 3.8  CL 106  CO2 28  GLUCOSE 92  BUN 10    CREATININE 0.65  CALCIUM 9.3  MG --  PHOS --   Liver Function Tests: No results found for this basename: AST:5,ALT:5,ALKPHOS:5,BILITOT:5,PROT:5,ALBUMIN:5 in the last 168 hours No results found for this basename: LIPASE:5,AMYLASE:5 in the last 168 hours No results found for this basename: AMMONIA:5 in the last 168 hours CBC:  Lab 06/03/12 2217  WBC 5.1  NEUTROABS 2.1  HGB 11.4*  HCT 36.3  MCV 72.3*  PLT 271   Cardiac Enzymes: No results found for this basename: CKTOTAL:5,CKMB:5,CKMBINDEX:5,TROPONINI:5 in the last 168 hours  BNP (last 3 results) No results found for this basename: PROBNP:3 in the last 8760 hours CBG: No results found for this basename: GLUCAP:5 in the last 168 hours  Radiological Exams on Admission: Dg Ankle Complete Left  06/04/2012  *RADIOLOGY REPORT*  Clinical Data: Left ankle and leg swelling.  LEFT ANKLE COMPLETE - 3+ VIEW  Comparison: 07/04/2005  Findings: Three views of the left ankle are negative for acute fracture or dislocation.  Suspect degenerative changes at the first MTP joint.  IMPRESSION: No acute bony abnormality in the left ankle.   Original Report Authenticated By: Richarda Overlie, M.D.       Assessment: Active Problems:  Cellulitis and abscess of leg  HEPATITIS C  DIABETES MELLITUS, TYPE II  DELUSIONAL DISORDER    Plan:    Admit to Observation Telemetry Bed IV Vancomycin started Venous Duplex US study ordered to evaluate for a DVT ( Also +D-Dimer=0.83) Pain Control PRN Reconcile Home Medications DVT prophylaxis   Code Status: FULL CODE Family Communication: N/A Disposition Plan: Return to Home Time spent: 2 Minutes  Ron Parker [facility] Pager [phone]  If 7PM-7AM, please contact night-coverage [email or web address] Password [facility] 06/04/2012, 2:38 AM

## 2012-06-04 NOTE — Progress Notes (Signed)
ANTIBIOTIC CONSULT NOTE - INITIAL  Pharmacy Consult for vancomycin Indication: cellulitis  No Known Allergies  Patient Measurements: Height: 5\' 3"  (160 cm) Weight: 147 lb 4.3 oz (66.8 kg) IBW/kg (Calculated) : 52.4   Vital Signs: Temp: 98.4 F (36.9 C) (09/28 0300) Temp src: Oral (09/28 0300) BP: 176/77 mmHg (09/28 0300) Pulse Rate: 58  (09/28 0300)  Labs:  Alvira Philips 06/03/12 2217  WBC 5.1  HGB 11.4*  PLT 271  LABCREA --  CREATININE 0.65   Estimated Creatinine Clearance: 60.1 ml/min (by C-G formula based on Cr of 0.65).  Microbiology: No results found for this or any previous visit (from the past 720 hour(s)).  Medical History: Past Medical History  Diagnosis Date  . Glaucoma   . Hepatitis   . Ear infection   . Hard of hearing   . Rheumatoid arthritis     Medications:  Prescriptions prior to admission  Medication Sig Dispense Refill  . bimatoprost (LUMIGAN) 0.03 % ophthalmic solution Place 1 drop into the left eye at bedtime.      . dorzolamide (TRUSOPT) 2 % ophthalmic solution Place 1 drop into the left eye 2 (two) times daily.       . Multiple Vitamin (MULTIVITAMIN WITH MINERALS) TABS Take 1 tablet by mouth daily.      . pilocarpine (PILOCAR) 2 % ophthalmic solution Place 1 drop into the left eye 4 (four) times daily.      . timolol (BETIMOL) 0.5 % ophthalmic solution Place 1 drop into the left eye 2 (two) times daily.        Scheduled:    . sodium chloride   Intravenous Once  . sodium chloride   Intravenous STAT  . enoxaparin (LOVENOX) injection  40 mg Subcutaneous Q24H  . lidocaine  5 mL Intradermal Once  . vancomycin  1,250 mg Intravenous Q12H  . vancomycin  1,000 mg Intravenous Once    Assessment: 71yo female c/o worsening swelling of LE as infection spread from toe, to begin IV ABX for cellulitis.  Goal of Therapy:  Vancomycin trough level 10-15 mcg/ml  Plan:  Rec'd vanc 1g in ED; will continue with vancomycin 1250mg  IV Q24H and monitor CBC,  Cx, levels prn.  Colleen Can PharmD BCPS 06/04/2012,3:13 AM

## 2012-06-04 NOTE — ED Provider Notes (Signed)
Medical screening examination/treatment/procedure(s) were performed by non-physician practitioner and as supervising physician I was immediately available for consultation/collaboration.  Juliet Rude. Rubin Payor, MD 06/04/12 2255

## 2012-06-04 NOTE — Progress Notes (Signed)
70/F admitted this am by Dr.Jenkins H/o Hep C, delusional disorder, here with LLE pain and swelling Continue IV Vancomycin Dopplers to r/o DVT Leg levation Home soon  Zannie Cove, MD [phone]

## 2012-06-04 NOTE — ED Notes (Signed)
Previous Audiological scientist and myself both failed IV attempts x2 each secondary to being unable to advance or thread IV catheter after achieving blood flash. IV team called

## 2012-06-05 DIAGNOSIS — Z8659 Personal history of other mental and behavioral disorders: Secondary | ICD-10-CM

## 2012-06-05 DIAGNOSIS — K759 Inflammatory liver disease, unspecified: Secondary | ICD-10-CM

## 2012-06-05 DIAGNOSIS — L02419 Cutaneous abscess of limb, unspecified: Principal | ICD-10-CM

## 2012-06-05 DIAGNOSIS — M7989 Other specified soft tissue disorders: Secondary | ICD-10-CM

## 2012-06-05 DIAGNOSIS — M79609 Pain in unspecified limb: Secondary | ICD-10-CM

## 2012-06-05 LAB — CBC
HCT: 34.7 % — ABNORMAL LOW (ref 36.0–46.0)
Hemoglobin: 11.1 g/dL — ABNORMAL LOW (ref 12.0–15.0)
MCV: 71.4 fL — ABNORMAL LOW (ref 78.0–100.0)
RDW: 13.7 % (ref 11.5–15.5)
WBC: 5 10*3/uL (ref 4.0–10.5)

## 2012-06-05 LAB — BASIC METABOLIC PANEL
BUN: 11 mg/dL (ref 6–23)
CO2: 26 mEq/L (ref 19–32)
Chloride: 106 mEq/L (ref 96–112)
Creatinine, Ser: 0.62 mg/dL (ref 0.50–1.10)
GFR calc Af Amer: >90 mL/min (ref >90)
Glucose, Bld: 102 mg/dL — ABNORMAL HIGH (ref 70–99)
Potassium: 3.6 mEq/L (ref 3.5–5.1)

## 2012-06-05 MED ORDER — SODIUM CHLORIDE 0.9 % IV SOLN
1250.0000 mg | INTRAVENOUS | Status: AC
  Administered 2012-06-06: 1250 mg via INTRAVENOUS
  Filled 2012-06-05: qty 1250

## 2012-06-05 MED ORDER — IBUPROFEN 600 MG PO TABS
600.0000 mg | ORAL_TABLET | Freq: Once | ORAL | Status: AC
  Administered 2012-06-05: 600 mg via ORAL
  Filled 2012-06-05: qty 1

## 2012-06-05 NOTE — Progress Notes (Signed)
Pt c/o sharp headache that starts in top of head and radiates down neck. Pt believes this stems from her ears and she would like and MRI of her head done. I informed her that the MD did not order and MRI of her head, but we would try to alleviate her headache with medication. MD notified, order received for Ibuprofen PO.

## 2012-06-05 NOTE — Progress Notes (Signed)
06/05/2012 Late Entry At approximately 2100, patient began complaining of right flank pain that was deep underneath the rib cage and hurt with each deep breath.  VS were T - 98.9 B/P 160/73 HR - 57 Resp - 18 and 94% on room air.  Lung sounds were decreased.  Called [facility] and received order for STAT CT Angio to rule out PE.  Oxycodone IR was given for pain at 2134.  Orders were carried out.  Called results of CT Angio to [facility] (results were negative for PE).  Also, advised provider that patient's heart rate is 55-60 on telemetry (I placed patient on telemetry as a precaution) and received ok to d/c telemetry.  Gave patient 1 mg IV Dilaudid at 0034 with complete pain relief.  Will continue to monitor patient.  Pa Tennant, Justine Null

## 2012-06-05 NOTE — Progress Notes (Signed)
VASCULAR LAB PRELIMINARY  PRELIMINARY  PRELIMINARY  PRELIMINARY  Left lower extremity venous Doppler completed.    Preliminary report:  There is no DVT or SVT noted in the left lower extremity.  Cannot rule out chronic DVT at origin of right profunda vein.  Diesel Lina, 06/05/2012, 10:56 AM

## 2012-06-05 NOTE — Progress Notes (Signed)
[facility]             Progress Note   Subjective: L leg pain and swelling improving  Objective: Vital signs in last 24 hours: Temp:  [97.8 F (36.6 C)-98.9 F (37.2 C)] 97.8 F (36.6 C) (09/29 1517) Pulse Rate:  [42-66] 42  (09/29 1517) Resp:  [18] 18  (09/29 1517) BP: (139-186)/(73-85) 139/78 mmHg (09/29 1517) SpO2:  [93 %-98 %] 98 % (09/29 1517) Weight change:  Last BM Date: 06/04/12  Intake/Output from previous day: 09/28 0701 - 09/29 0700 In: 790 [P.O.:240; I.V.:300; IV Piggyback:250] Out: 1500 [Urine:1500] Total I/O In: 1590 [P.O.:240; I.V.:1350] Out: -    Physical Exam: General: Alert, awake, oriented x3, in no acute distress. HEENT: No bruits, no goiter., small 1cm abscess/boil , post auricular Heart: Regular rate and rhythm, without murmurs, rubs, gallops. Lungs: Clear to auscultation bilaterally. Abdomen: Soft, nontender, nondistended, positive bowel sounds. Extremities: LLE with erythema/edema and tenderness. Neuro: Grossly intact, nonfocal.    Lab Results: Basic Metabolic Panel:  Basename 06/05/12 0530 06/04/12 0645  NA 140 141  K 3.6 3.3*  CL 106 106  CO2 26 27  GLUCOSE 102* 94  BUN 11 10  CREATININE 0.62 0.65  CALCIUM 8.7 9.0  MG -- --  PHOS -- --   Liver Function Tests: No results found for this basename: AST:2,ALT:2,ALKPHOS:2,BILITOT:2,PROT:2,ALBUMIN:2 in the last 72 hours No results found for this basename: LIPASE:2,AMYLASE:2 in the last 72 hours No results found for this basename: AMMONIA:2 in the last 72 hours CBC:  Basename 06/05/12 0530 06/04/12 0645 06/03/12 2217  WBC 5.0 5.2 --  NEUTROABS -- -- 2.1  HGB 11.1* 10.7* --  HCT 34.7* 34.4* --  MCV 71.4* 71.4* --  PLT 252 266 --   Cardiac Enzymes: No results found for this basename: CKTOTAL:3,CKMB:3,CKMBINDEX:3,TROPONINI:3 in the last 72 hours BNP: No results found for this basename: PROBNP:3 in the last 72 hours D-Dimer:  Alvira Philips 06/04/12 0155  DDIMER 0.86*    CBG: No results found for this basename: GLUCAP:6 in the last 72 hours Hemoglobin A1C: No results found for this basename: HGBA1C in the last 72 hours Fasting Lipid Panel: No results found for this basename: CHOL,HDL,LDLCALC,TRIG,CHOLHDL,LDLDIRECT in the last 72 hours Thyroid Function Tests: No results found for this basename: TSH,T4TOTAL,FREET4,T3FREE,THYROIDAB in the last 72 hours Anemia Panel: No results found for this basename: VITAMINB12,FOLATE,FERRITIN,TIBC,IRON,RETICCTPCT in the last 72 hours Coagulation: No results found for this basename: LABPROT:2,INR:2 in the last 72 hours Urine Drug Screen: Drugs of Abuse     Component Value Date/Time   LABOPIA NONE DETECTED 06/11/2011 1030   COCAINSCRNUR NONE DETECTED 06/11/2011 1030   LABBENZ NONE DETECTED 06/11/2011 1030   AMPHETMU NONE DETECTED 06/11/2011 1030   THCU NONE DETECTED 06/11/2011 1030   LABBARB NONE DETECTED 06/11/2011 1030    Alcohol Level: No results found for this basename: ETH:2 in the last 72 hours Urinalysis:  Basename 06/04/12 0209  COLORURINE YELLOW  LABSPEC 1.008  PHURINE 7.5  GLUCOSEU NEGATIVE  HGBUR TRACE*  BILIRUBINUR NEGATIVE  KETONESUR NEGATIVE  PROTEINUR NEGATIVE  UROBILINOGEN 0.2  NITRITE NEGATIVE  LEUKOCYTESUR NEGATIVE   Recent Results (from the past 240 hour(s))  WOUND CULTURE     Status: Normal (Preliminary result)   Collection Time   06/04/12 10:49 AM      Component Value Range Status Comment   Specimen Description WOUND EAR   Final    Special Requests NONE   Final    Gram Stain PENDING  Incomplete    Culture Culture reincubated for better growth   Final    Report Status PENDING   Incomplete     Studies/Results: Dg Ankle Complete Left  06/04/2012  *RADIOLOGY REPORT*  Clinical Data: Left ankle and leg swelling.  LEFT ANKLE COMPLETE - 3+ VIEW  Comparison: 07/04/2005  Findings: Three views of the left ankle are negative for acute fracture or dislocation.  Suspect degenerative changes at  the first MTP joint.  IMPRESSION: No acute bony abnormality in the left ankle.   Original Report Authenticated By: Richarda Overlie, M.D.    Ct Angio Chest Pe W/cm &/or Wo Cm  06/04/2012  *RADIOLOGY REPORT*  Clinical Data: Chest pain, shortness of breath and elevated D- dimer.  CT ANGIOGRAPHY CHEST  Technique:  Multidetector CT imaging of the chest using the standard protocol during bolus administration of intravenous contrast. Multiplanar reconstructed images including MIPs were obtained and reviewed to evaluate the vascular anatomy.  Contrast: 80mL OMNIPAQUE IOHEXOL 350 MG/ML SOLN  Comparison: Chest radiograph 07/03/2011  Findings: No evidence for pulmonary embolism.  Small calcifications in the proximal left anterior descending coronary artery.  There is no significant pericardial or pleural fluid.   No acute abnormalities in the visualized upper abdomen.  There is a large amount of stool within the visualized colon.  The patient also has a moderate sized hiatal hernia.  No significant chest lymphadenopathy.  Trachea and mainstem bronchi are patent.  Emphysematous changes in the upper lungs.  There are subtle patchy nodular and ground-glass densities at the base of the medial right middle lobe.  Small nodular area on sequence 602, image 29.  No acute bony abnormality.  IMPRESSION: No evidence for pulmonary embolism.  Emphysematous changes.  Subtle patchy nodular densities at the base of the right middle lobe are nonspecific.  This could represent atelectasis or small focus of infection or inflammation.  Due to the small nodules, recommend a 3-6 month follow-up CT to ensure resolution.  Hiatal hernia.   Original Report Authenticated By: Richarda Overlie, M.D.     Medications: Scheduled Meds:   . enoxaparin (LOVENOX) injection  40 mg Subcutaneous Q24H  . ibuprofen  600 mg Oral Once  . influenza  inactive virus vaccine  0.5 mL Intramuscular Tomorrow-1000  . pneumococcal 23 valent vaccine  0.5 mL Intramuscular  Tomorrow-1000  . vancomycin  1,250 mg Intravenous Q24H  . DISCONTD: vancomycin  1,250 mg Intravenous Q12H   Continuous Infusions:   . sodium chloride 75 mL/hr at 06/05/12 0034   PRN Meds:.acetaminophen, acetaminophen, alum & mag hydroxide-simeth, HYDROmorphone (DILAUDID) injection, iohexol, ondansetron (ZOFRAN) IV, ondansetron, oxyCODONE, zolpidem  Assessment/Plan:  1. Cellulitis of LLE Continue Iv vanc Dopplers to r/o DVT still pending Leg elevation  2. Small post auricular abscess/boil: continue IV abx, wound care  3. Delusional disorder  4. Hep C  DVt prophylaxis: lovenox  Home soon     Time spent coordinating care:   LOS: 2 days   [facility] Pager: [phone] 06/05/2012, 5:49 PM

## 2012-06-06 DIAGNOSIS — D649 Anemia, unspecified: Secondary | ICD-10-CM

## 2012-06-06 MED ORDER — AMOXICILLIN-POT CLAVULANATE 500-125 MG PO TABS
1.0000 | ORAL_TABLET | Freq: Three times a day (TID) | ORAL | Status: DC
  Administered 2012-06-06 – 2012-06-07 (×3): 500 mg via ORAL
  Filled 2012-06-06 (×5): qty 1

## 2012-06-06 MED ORDER — DOXYCYCLINE HYCLATE 100 MG PO TABS
100.0000 mg | ORAL_TABLET | Freq: Two times a day (BID) | ORAL | Status: DC
  Administered 2012-06-07: 100 mg via ORAL
  Filled 2012-06-06 (×2): qty 1

## 2012-06-06 NOTE — Progress Notes (Addendum)
[facility]             Progress Note   Subjective: L leg pain and swelling improving  Objective: Vital signs in last 24 hours: Temp:  [97.8 F (36.6 C)-100.1 F (37.8 C)] 100.1 F (37.8 C) (09/30 0536) Pulse Rate:  [42-77] 74  (09/30 0536) Resp:  [16-18] 16  (09/30 0536) BP: (111-170)/(68-90) 111/68 mmHg (09/30 0536) SpO2:  [96 %-98 %] 96 % (09/30 0536) Weight:  [65.772 kg (145 lb)] 65.772 kg (145 lb) (09/29 2108) Weight change:  Last BM Date: 06/04/12  Intake/Output from previous day: 09/29 0701 - 09/30 0700 In: 3102.5 [P.O.:720; I.V.:2382.5] Out: -      Physical Exam: General: Alert, awake, oriented x3, in no acute distress. HEENT: No bruits, no goiter., small 1cm abscess/boil , post auricular now with crust/scab formation Heart: Regular rate and rhythm, without murmurs, rubs, gallops. Lungs: Clear to auscultation bilaterally. Abdomen: Soft, nontender, nondistended, positive bowel sounds. Extremities: LLE with erythema/edema and tenderness. improving Neuro: Grossly intact, nonfocal.    Lab Results: Basic Metabolic Panel:  Basename 06/05/12 0530 06/04/12 0645  NA 140 141  K 3.6 3.3*  CL 106 106  CO2 26 27  GLUCOSE 102* 94  BUN 11 10  CREATININE 0.62 0.65  CALCIUM 8.7 9.0  MG -- --  PHOS -- --   Liver Function Tests: No results found for this basename: AST:2,ALT:2,ALKPHOS:2,BILITOT:2,PROT:2,ALBUMIN:2 in the last 72 hours No results found for this basename: LIPASE:2,AMYLASE:2 in the last 72 hours No results found for this basename: AMMONIA:2 in the last 72 hours CBC:  Basename 06/05/12 0530 06/04/12 0645 06/03/12 2217  WBC 5.0 5.2 --  NEUTROABS -- -- 2.1  HGB 11.1* 10.7* --  HCT 34.7* 34.4* --  MCV 71.4* 71.4* --  PLT 252 266 --   Cardiac Enzymes: No results found for this basename: CKTOTAL:3,CKMB:3,CKMBINDEX:3,TROPONINI:3 in the last 72 hours BNP: No results found for this basename: PROBNP:3 in the last 72  hours D-Dimer:  Alvira Philips 06/04/12 0155  DDIMER 0.86*   CBG: No results found for this basename: GLUCAP:6 in the last 72 hours Hemoglobin A1C: No results found for this basename: HGBA1C in the last 72 hours Fasting Lipid Panel: No results found for this basename: CHOL,HDL,LDLCALC,TRIG,CHOLHDL,LDLDIRECT in the last 72 hours Thyroid Function Tests: No results found for this basename: TSH,T4TOTAL,FREET4,T3FREE,THYROIDAB in the last 72 hours Anemia Panel: No results found for this basename: VITAMINB12,FOLATE,FERRITIN,TIBC,IRON,RETICCTPCT in the last 72 hours Coagulation: No results found for this basename: LABPROT:2,INR:2 in the last 72 hours Urine Drug Screen: Drugs of Abuse     Component Value Date/Time   LABOPIA NONE DETECTED 06/11/2011 1030   COCAINSCRNUR NONE DETECTED 06/11/2011 1030   LABBENZ NONE DETECTED 06/11/2011 1030   AMPHETMU NONE DETECTED 06/11/2011 1030   THCU NONE DETECTED 06/11/2011 1030   LABBARB NONE DETECTED 06/11/2011 1030    Alcohol Level: No results found for this basename: ETH:2 in the last 72 hours Urinalysis:  Basename 06/04/12 0209  COLORURINE YELLOW  LABSPEC 1.008  PHURINE 7.5  GLUCOSEU NEGATIVE  HGBUR TRACE*  BILIRUBINUR NEGATIVE  KETONESUR NEGATIVE  PROTEINUR NEGATIVE  UROBILINOGEN 0.2  NITRITE NEGATIVE  LEUKOCYTESUR NEGATIVE   Recent Results (from the past 240 hour(s))  WOUND CULTURE     Status: Normal (Preliminary result)   Collection Time   06/04/12 10:49 AM      Component Value Range Status Comment   Specimen Description WOUND EAR   Final    Special Requests NONE  Final    Gram Stain PENDING   Incomplete    Culture FEW GRAM NEGATIVE RODS   Final    Report Status PENDING   Incomplete     Studies/Results: Ct Angio Chest Pe W/cm &/or Wo Cm  06/04/2012  *RADIOLOGY REPORT*  Clinical Data: Chest pain, shortness of breath and elevated D- dimer.  CT ANGIOGRAPHY CHEST  Technique:  Multidetector CT imaging of the chest using the standard  protocol during bolus administration of intravenous contrast. Multiplanar reconstructed images including MIPs were obtained and reviewed to evaluate the vascular anatomy.  Contrast: 80mL OMNIPAQUE IOHEXOL 350 MG/ML SOLN  Comparison: Chest radiograph 07/03/2011  Findings: No evidence for pulmonary embolism.  Small calcifications in the proximal left anterior descending coronary artery.  There is no significant pericardial or pleural fluid.   No acute abnormalities in the visualized upper abdomen.  There is a large amount of stool within the visualized colon.  The patient also has a moderate sized hiatal hernia.  No significant chest lymphadenopathy.  Trachea and mainstem bronchi are patent.  Emphysematous changes in the upper lungs.  There are subtle patchy nodular and ground-glass densities at the base of the medial right middle lobe.  Small nodular area on sequence 602, image 29.  No acute bony abnormality.  IMPRESSION: No evidence for pulmonary embolism.  Emphysematous changes.  Subtle patchy nodular densities at the base of the right middle lobe are nonspecific.  This could represent atelectasis or small focus of infection or inflammation.  Due to the small nodules, recommend a 3-6 month follow-up CT to ensure resolution.  Hiatal hernia.   Original Report Authenticated By: Richarda Overlie, M.D.     Medications: Scheduled Meds:    . enoxaparin (LOVENOX) injection  40 mg Subcutaneous Q24H  . ibuprofen  600 mg Oral Once  . influenza  inactive virus vaccine  0.5 mL Intramuscular Tomorrow-1000  . pneumococcal 23 valent vaccine  0.5 mL Intramuscular Tomorrow-1000  . vancomycin  1,250 mg Intravenous Q24H  . DISCONTD: vancomycin  1,250 mg Intravenous Q12H   Continuous Infusions:    . sodium chloride 75 mL/hr at 06/06/12 0505   PRN Meds:.acetaminophen, acetaminophen, alum & mag hydroxide-simeth, HYDROmorphone (DILAUDID) injection, ondansetron (ZOFRAN) IV, ondansetron, oxyCODONE,  zolpidem  Assessment/Plan:  1. Cellulitis of LLE Continue Iv vanc today, transition to oral doxy later today Low grade fever this am Dopplers to r/o DVT still pending Leg elevation  2. Small post auricular abscess/boil:  Starting to scab over continue IV abx, wound care  3. Delusional disorder  4. Hep C  DVt prophylaxis: lovenox  Home tomorrow     Time spent coordinating care:   LOS: 3 days   [facility] Pager: [phone] 06/06/2012, 8:54 AM

## 2012-06-07 LAB — WOUND CULTURE

## 2012-06-07 MED ORDER — OXYCODONE HCL 5 MG PO TABS: 5.0000 mg | ORAL_TABLET | Freq: Four times a day (QID) | ORAL | Status: DC | PRN

## 2012-06-07 MED ORDER — DOXYCYCLINE HYCLATE 100 MG PO TABS: 100.0000 mg | ORAL_TABLET | Freq: Two times a day (BID) | ORAL | Status: DC

## 2012-06-07 MED ORDER — AMOXICILLIN-POT CLAVULANATE 500-125 MG PO TABS: 1.0000 | ORAL_TABLET | Freq: Three times a day (TID) | ORAL | Status: DC

## 2012-06-16 ENCOUNTER — Emergency Department (HOSPITAL_COMMUNITY): Payer: Medicare Other

## 2012-06-16 ENCOUNTER — Observation Stay (HOSPITAL_COMMUNITY)
Admission: EM | Admit: 2012-06-16 | Discharge: 2012-06-18 | Disposition: A | Discharge status: Home / Self Care | Payer: Medicare Other | Attending: Internal Medicine | Admitting: Internal Medicine

## 2012-06-16 ENCOUNTER — Encounter (HOSPITAL_COMMUNITY): Payer: Self-pay | Admitting: *Deleted

## 2012-06-16 DIAGNOSIS — H9190 Unspecified hearing loss, unspecified ear: Secondary | ICD-10-CM | POA: Insufficient documentation

## 2012-06-16 DIAGNOSIS — M7989 Other specified soft tissue disorders: Secondary | ICD-10-CM | POA: Diagnosis present

## 2012-06-16 DIAGNOSIS — Z8659 Personal history of other mental and behavioral disorders: Secondary | ICD-10-CM

## 2012-06-16 DIAGNOSIS — L02818 Cutaneous abscess of other sites: Secondary | ICD-10-CM

## 2012-06-16 DIAGNOSIS — E119 Type 2 diabetes mellitus without complications: Secondary | ICD-10-CM | POA: Diagnosis present

## 2012-06-16 DIAGNOSIS — H40009 Preglaucoma, unspecified, unspecified eye: Secondary | ICD-10-CM

## 2012-06-16 DIAGNOSIS — H60509 Unspecified acute noninfective otitis externa, unspecified ear: Secondary | ICD-10-CM

## 2012-06-16 DIAGNOSIS — J189 Pneumonia, unspecified organism: Principal | ICD-10-CM

## 2012-06-16 DIAGNOSIS — Z79899 Other long term (current) drug therapy: Secondary | ICD-10-CM | POA: Insufficient documentation

## 2012-06-16 DIAGNOSIS — R109 Unspecified abdominal pain: Secondary | ICD-10-CM

## 2012-06-16 DIAGNOSIS — B1710 Acute hepatitis C without hepatic coma: Secondary | ICD-10-CM | POA: Diagnosis present

## 2012-06-16 DIAGNOSIS — L03119 Cellulitis of unspecified part of limb: Secondary | ICD-10-CM

## 2012-06-16 DIAGNOSIS — F22 Delusional disorders: Secondary | ICD-10-CM | POA: Diagnosis present

## 2012-06-16 DIAGNOSIS — R079 Chest pain, unspecified: Secondary | ICD-10-CM | POA: Diagnosis present

## 2012-06-16 DIAGNOSIS — H409 Unspecified glaucoma: Secondary | ICD-10-CM | POA: Insufficient documentation

## 2012-06-16 DIAGNOSIS — M069 Rheumatoid arthritis, unspecified: Secondary | ICD-10-CM | POA: Insufficient documentation

## 2012-06-16 DIAGNOSIS — I959 Hypotension, unspecified: Secondary | ICD-10-CM | POA: Insufficient documentation

## 2012-06-16 DIAGNOSIS — J309 Allergic rhinitis, unspecified: Secondary | ICD-10-CM

## 2012-06-16 DIAGNOSIS — H7090 Unspecified mastoiditis, unspecified ear: Secondary | ICD-10-CM

## 2012-06-16 DIAGNOSIS — B1920 Unspecified viral hepatitis C without hepatic coma: Secondary | ICD-10-CM | POA: Insufficient documentation

## 2012-06-16 DIAGNOSIS — D649 Anemia, unspecified: Secondary | ICD-10-CM

## 2012-06-16 DIAGNOSIS — H6690 Otitis media, unspecified, unspecified ear: Secondary | ICD-10-CM

## 2012-06-16 DIAGNOSIS — K759 Inflammatory liver disease, unspecified: Secondary | ICD-10-CM

## 2012-06-16 DIAGNOSIS — D509 Iron deficiency anemia, unspecified: Secondary | ICD-10-CM | POA: Insufficient documentation

## 2012-06-16 DIAGNOSIS — M1710 Unilateral primary osteoarthritis, unspecified knee: Secondary | ICD-10-CM

## 2012-06-16 LAB — CBC
HCT: 36.5 % (ref 36.0–46.0)
Hemoglobin: 11.7 g/dL — ABNORMAL LOW (ref 12.0–15.0)
MCH: 22.7 pg — ABNORMAL LOW (ref 26.0–34.0)
MCHC: 32.1 g/dL (ref 30.0–36.0)
MCV: 70.7 fL — ABNORMAL LOW (ref 78.0–100.0)
RBC: 5.16 MIL/uL — ABNORMAL HIGH (ref 3.87–5.11)

## 2012-06-16 LAB — COMPREHENSIVE METABOLIC PANEL
BUN: 11 mg/dL (ref 6–23)
CO2: 27 mEq/L (ref 19–32)
Calcium: 9.5 mg/dL (ref 8.4–10.5)
Creatinine, Ser: 0.72 mg/dL (ref 0.50–1.10)
GFR calc Af Amer: >90 mL/min (ref >90)
GFR calc non Af Amer: 85 mL/min — ABNORMAL LOW (ref >90)
Glucose, Bld: 99 mg/dL (ref 70–99)
Total Protein: 7.4 g/dL (ref 6.0–8.3)

## 2012-06-16 LAB — POCT I-STAT TROPONIN I

## 2012-06-16 MED ORDER — ASPIRIN 81 MG PO CHEW
324.0000 mg | CHEWABLE_TABLET | Freq: Once | ORAL | Status: AC
  Administered 2012-06-16: 324 mg via ORAL
  Filled 2012-06-16: qty 4

## 2012-06-16 NOTE — Discharge Summary (Signed)
Physician Discharge Summary  Patient ID: Krystine Pabst MRN: [id number] DOB/AGE: 12/30/1940 70 y.o.  Admit date: 06/03/2012 Discharge date: 06/07/2012  Primary Care Physician:  Bebe Liter, NP   Discharge Diagnoses:     Cellulitis of L leg  Small abscess periauricular improved  HEPATITIS C  DIABETES MELLITUS, TYPE II  DELUSIONAL DISORDER       Medication List     As of 06/16/2012  3:38 PM    TAKE these medications         amoxicillin-clavulanate 500-125 MG per tablet   Commonly known as: AUGMENTIN   Take 1 tablet (500 mg total) by mouth 3 (three) times daily. For 6 days      bimatoprost 0.03 % ophthalmic solution   Commonly known as: LUMIGAN   Place 1 drop into the left eye at bedtime.      dorzolamide 2 % ophthalmic solution   Commonly known as: TRUSOPT   Place 1 drop into the left eye 2 (two) times daily.      doxycycline 100 MG tablet   Commonly known as: VIBRA-TABS   Take 1 tablet (100 mg total) by mouth every 12 (twelve) hours. For 6 days      multivitamin with minerals Tabs   Take 1 tablet by mouth daily.      oxyCODONE 5 MG immediate release tablet   Commonly known as: Oxy IR/ROXICODONE   Take 1 tablet (5 mg total) by mouth every 6 (six) hours as needed.      pilocarpine 2 % ophthalmic solution   Commonly known as: PILOCAR   Place 1 drop into the left eye 4 (four) times daily.      timolol 0.5 % ophthalmic solution   Commonly known as: BETIMOL   Place 1 drop into the left eye 2 (two) times daily.         Disposition and Follow-up:  PCP in 1 week  Significant Diagnostic Studies:  Duplex USG of L leg: negative for DVT  Brief H and P: Navreet Bolda is a 71 y.o. female who presents to the ED with complaints of increased swelling and worsening pain of the Left lower leg x 6 weeks. She denies having any injuries. She denies having fevers or chills or chest pain or SOB. She was evaluated in the ED and was placed on IV Vancomycin for  Cellulitis, however there was concern for a possible DVT. She was referred for medical admission.    Hospital Course:  1. Cellulitis of LLE  Improved with IV vanc today, then  transitioned to oral doxy   Dopplers negative for DVT Leg elevation   2. Small post auricular abscess/boil: draining spontaneously Starting to scab over  continue IV abx, wound care   3. Delusional disorder : Fu with mental health  4. Hep C: Fu with PCP   Time spent on Discharge:  Signed: Tykeem Lanzer [facility]  06/16/2012, 3:38 PM

## 2012-06-16 NOTE — ED Provider Notes (Signed)
History     CSN: [id number]  Arrival date & time 06/16/12  1308   First MD Initiated Contact with Patient 06/16/12 2037      Chief Complaint  Patient presents with  . Chest Pain     Patient is a 71 y.o. female presenting with chest pain. The history is provided by the patient and a caregiver.  Chest Pain The chest pain began yesterday. Chest pain occurs intermittently. The chest pain is worsening. Associated with: rest. The severity of the pain is moderate. The quality of the pain is described as sharp. Radiates to: radiates from left chest to right chest. Chest pain is worsened by certain positions and deep breathing. Primary symptoms include shortness of breath. Pertinent negatives for primary symptoms include no fever.  Associated symptoms include lower extremity edema. Treatments tried: rest.   pt presents to the ED for chest pain - she reports it radiates back and forth from left side to right side.  She also reports some SOB as well.  No abd pain. No vomiting.  She reports mild cough.  No fever is reported  She also has right LE edema for several weeks.  She denies injury to the limb  Past Medical History  Diagnosis Date  . Glaucoma(365)   . Hepatitis   . Ear infection   . Hard of hearing   . Rheumatoid arthritis     Past Surgical History  Procedure Date  . Inner ear surgery   . Eye surgery     No family history on file.  History  Substance Use Topics  . Smoking status: Never Smoker   . Smokeless tobacco: Never Used  . Alcohol Use: No    OB History    Grav Para Term Preterm Abortions TAB SAB Ect Mult Living                  Review of Systems  Constitutional: Negative for fever.  Respiratory: Positive for shortness of breath.   Cardiovascular: Positive for chest pain.  All other systems reviewed and are negative.    Allergies  Review of patient's allergies indicates no known allergies.  Home Medications   Current Outpatient Rx  Name Route Sig  Dispense Refill  . BIMATOPROST 0.03 % OP SOLN Left Eye Place 1 drop into the left eye at bedtime.    . DORZOLAMIDE HCL 2 % OP SOLN Left Eye Place 1 drop into the left eye 2 (two) times daily.     . ADULT MULTIVITAMIN W/MINERALS CH Oral Take 1 tablet by mouth daily.    Marland Kitchen NAPROXEN SODIUM 220 MG PO TABS Oral Take 220 mg by mouth 2 (two) times daily as needed. For headache/pain    . PILOCARPINE HCL 2 % OP SOLN Left Eye Place 1 drop into the left eye 4 (four) times daily.    Marland Kitchen ALKA-SELTZER ANTI-GAS PO Oral Take 1 tablet by mouth daily as needed. For gas    . TIMOLOL HEMIHYDRATE 0.5 % OP SOLN Left Eye Place 1 drop into the left eye 2 (two) times daily.      BP 99/61  Pulse 87  Resp 25  SpO2 95% BP 105/55  Pulse 84  Temp 98 F (36.7 C)  Resp 15  SpO2 95%   Physical Exam CONSTITUTIONAL: Well developed/well nourished HEAD AND FACE: Normocephalic/atraumatic EYES: EOMI/PERRL ENMT: Mucous membranes moist NECK: supple no meningeal signs SPINE:entire spine nontender CV: S1/S2 noted, no murmurs/rubs/gallops noted Chest - diffusely tender to palpation  but no erythema/crepitance noted LUNGS: Lungs are clear to auscultation bilaterally, no apparent distress ABDOMEN: soft, nontender, no rebound or guarding GU:no cva tenderness NEURO: Pt is awake/alert, moves all extremitiesx4 EXTREMITIES: pulses normal, full ROM.  Pitting Edema/tenderness noted to right LE with noticeable difference when compared to left LE.  No significant erythema noted.  Distal pulses intact/equal.   SKIN: warm, color normal PSYCH: mildly anxious  ED Course  Procedures   Labs Reviewed  CBC - Abnormal; Notable for the following:    WBC 10.8 (*)     RBC 5.16 (*)     Hemoglobin 11.7 (*)     MCV 70.7 (*)     MCH 22.7 (*)     Platelets 454 (*)     All other components within normal limits  COMPREHENSIVE METABOLIC PANEL - Abnormal; Notable for the following:    Alkaline Phosphatase 129 (*)     Total Bilirubin 0.2 (*)       GFR calc non Af Amer 85 (*)     All other components within normal limits  POCT I-STAT TROPONIN I   Dg Chest 2 View  06/16/2012  *RADIOLOGY REPORT*  Clinical Data: Chest pain and shortness of breath for 2 days  CHEST - 2 VIEW  Comparison: 07/03/2011; 06/11/2011; chest CT - 06/04/2012  Findings:  Grossly unchanged cardiac silhouette and mediastinal contours given patient rotation to the left.  Worsening bibasilar heterogeneous opacities, left greater than right.  The lungs remain hyperexpanded with flattening of the bilateral hemidiaphragms.  No pleural effusion or pneumothorax.  Unchanged bones including scoliotic curvature of the thoracolumbar spine.  Degenerative change of the right glenohumeral joint.  IMPRESSION:  Worsened bibasilar heterogeneous opacities, favored to represent atelectasis though developing infection not excluded.   Original Report Authenticated By: Waynard Reeds, M.D.      Pt with chest pain, and also have right LE swelling/pain.  Concern for DVT.  Pt is a difficult historian   Recently had admission and left LE DVT study but no DVT found.  Will admit to hospital for monitoring.  Pt is poor candidate for outpatient right LE DVT study given medical history and likely difficulty with transportation.  D/w dr Onalee Hua to admit to OBS.  Will hold anticoagulants  MDM  Nursing notes including past medical history and social history reviewed and considered in documentation Labs/vital reviewed and considered xrays reviewed and considered Previous records reviewed and considered       Date: 06/16/2012  Rate: 75  Rhythm: normal sinus rhythm  QRS Axis: normal  Intervals: normal  ST/T Wave abnormalities: normal  Conduction Disutrbances:none  Narrative Interpretation:   Old EKG Reviewed: unchanged     Joya Gaskins, MD 06/16/12 2300

## 2012-06-16 NOTE — ED Notes (Signed)
Patient with left sided chest pain, point to her left rib cage area as the location for the pain.  Patient states that the pain radiates to her back.  Patient states that she experienced SOB and nausea.  Patient is poor historian

## 2012-06-16 NOTE — H&P (Signed)
PCP:   Bebe Liter, NP   Chief Complaint:  cp  HPI: 71 yo female delusional disorder comes in with 2 days of reprod sscp the rle swelling.  She was just d/c from hosp tx with lle cellulitis and had swelling in her lle had neg u/s of lle only then.  Says her RLE has been swollen for over 3 months.  Currently cp free.  Denies sob.  No redness to legs completed abx from last hosp.  No fevrs.  No cough.  Pt unreliable historian and has many delusions particularly about being able to "hear" the bacteria that is in her bed and cough at home, this is chronic issue.  Review of Systems:  O/w neg  Past Medical History: Past Medical History  Diagnosis Date  . Glaucoma(365)   . Hepatitis   . Ear infection   . Hard of hearing   . Rheumatoid arthritis    Past Surgical History  Procedure Date  . Inner ear surgery   . Eye surgery     Medications: Prior to Admission medications   Medication Sig Start Date End Date Taking? Authorizing Provider  bimatoprost (LUMIGAN) 0.03 % ophthalmic solution Place 1 drop into the left eye at bedtime.   Yes Historical Provider, MD  dorzolamide (TRUSOPT) 2 % ophthalmic solution Place 1 drop into the left eye 2 (two) times daily.    Yes Historical Provider, MD  Multiple Vitamin (MULTIVITAMIN WITH MINERALS) TABS Take 1 tablet by mouth daily.   Yes Historical Provider, MD  naproxen sodium (ANAPROX) 220 MG tablet Take 220 mg by mouth 2 (two) times daily as needed. For headache/pain   Yes Historical Provider, MD  pilocarpine (PILOCAR) 2 % ophthalmic solution Place 1 drop into the left eye 4 (four) times daily.   Yes Historical Provider, MD  Simethicone (ALKA-SELTZER ANTI-GAS PO) Take 1 tablet by mouth daily as needed. For gas   Yes Historical Provider, MD  timolol (BETIMOL) 0.5 % ophthalmic solution Place 1 drop into the left eye 2 (two) times daily.    Yes Historical Provider, MD    Allergies:  No Known Allergies  Social History:  reports that she has  never smoked. She has never used smokeless tobacco. She reports that she does not drink alcohol or use illicit drugs.  Family History: nonconttibutory  Physical Exam: Filed Vitals:   06/16/12 2030 06/16/12 2100 06/16/12 2130  BP: 114/61 123/60 99/61  Pulse: 86 88 87  Resp: 25 15 25   SpO2: 96% 95% 95%   General appearance: alert, cooperative and no distress Lungs: clear to auscultation bilaterally Heart: regular rate and rhythm, S1, S2 normal, no murmur, click, rub or gallop reprod cp above left breast with palpation. Abdomen: soft, non-tender; bowel sounds normal; no masses,  no organomegaly Extremities: edema rle more swollent han left Pulses: 2+ and symmetric Skin: Skin color, texture, turgor normal. No rashes or lesions Neurologic: Grossly normal    Labs on Admission:   [facility] 06/16/12 1832  NA 143  K 4.0  CL 105  CO2 27  GLUCOSE 99  BUN 11  CREATININE 0.72  CALCIUM 9.5  MG --  PHOS --    Basename 06/16/12 1832  AST 26  ALT 17  ALKPHOS 129*  BILITOT 0.2*  PROT 7.4  ALBUMIN 3.7    Basename 06/16/12 1832  WBC 10.8*  NEUTROABS --  HGB 11.7*  HCT 36.5  MCV 70.7*  PLT 454*   Radiological Exams on Admission: Dg Chest 2 View  06/16/2012  *RADIOLOGY REPORT*  Clinical Data: Chest pain and shortness of breath for 2 days  CHEST - 2 VIEW  Comparison: 07/03/2011; 06/11/2011; chest CT - 06/04/2012  Findings:  Grossly unchanged cardiac silhouette and mediastinal contours given patient rotation to the left.  Worsening bibasilar heterogeneous opacities, left greater than right.  The lungs remain hyperexpanded with flattening of the bilateral hemidiaphragms.  No pleural effusion or pneumothorax.  Unchanged bones including scoliotic curvature of the thoracolumbar spine.  Degenerative change of the right glenohumeral joint.  IMPRESSION:  Worsened bibasilar heterogeneous opacities, favored to represent atelectasis though developing infection not excluded.   Original Report  Authenticated By: Waynard Reeds, M.D.    Assessment/Plan Present on Admission:  71 yo female with atypical cp and rle swelling .Chest pain .Swelling of extremity .DELUSIONAL DISORDER .DIABETES MELLITUS, TYPE II .HEPATITIS C  Concern for dvt in leg.  Will obs overnight serial enzyems and ck echo of heart.  Will also obtain u/s legs to r/o dvt.  Doubt any PE but will monitor closely for any development of sob, tachycardia, hypoxemia.  Cp is reproducible on exam.   Rubi Tooley [phone] 06/16/2012, 10:17 PM

## 2012-06-16 NOTE — ED Notes (Signed)
Bilateral ankle edema x 4 weeks.

## 2012-06-17 DIAGNOSIS — I059 Rheumatic mitral valve disease, unspecified: Secondary | ICD-10-CM

## 2012-06-17 DIAGNOSIS — J189 Pneumonia, unspecified organism: Secondary | ICD-10-CM

## 2012-06-17 DIAGNOSIS — D649 Anemia, unspecified: Secondary | ICD-10-CM

## 2012-06-17 DIAGNOSIS — J309 Allergic rhinitis, unspecified: Secondary | ICD-10-CM

## 2012-06-17 DIAGNOSIS — R109 Unspecified abdominal pain: Secondary | ICD-10-CM

## 2012-06-17 LAB — HEMOGLOBIN A1C: Hgb A1c MFr Bld: 5.8 % — ABNORMAL HIGH (ref <5.7)

## 2012-06-17 LAB — URINALYSIS, ROUTINE W REFLEX MICROSCOPIC
Bilirubin Urine: NEGATIVE
Ketones, ur: NEGATIVE mg/dL
Nitrite: NEGATIVE
Specific Gravity, Urine: 1.016 (ref 1.005–1.030)
Urobilinogen, UA: 0.2 mg/dL (ref 0.0–1.0)

## 2012-06-17 LAB — CBC
HCT: 33.7 % — ABNORMAL LOW (ref 36.0–46.0)
MCH: 22.5 pg — ABNORMAL LOW (ref 26.0–34.0)
MCV: 70.2 fL — ABNORMAL LOW (ref 78.0–100.0)
Platelets: 443 10*3/uL — ABNORMAL HIGH (ref 150–400)
RDW: 13.2 % (ref 11.5–15.5)
WBC: 8 10*3/uL (ref 4.0–10.5)

## 2012-06-17 LAB — COMPREHENSIVE METABOLIC PANEL
Albumin: 2.6 g/dL — ABNORMAL LOW (ref 3.5–5.2)
Alkaline Phosphatase: 61 U/L (ref 39–117)
BUN: 13 mg/dL (ref 6–23)
Creatinine, Ser: 0.53 mg/dL (ref 0.50–1.10)
GFR calc Af Amer: >90 mL/min (ref >90)
Glucose, Bld: 123 mg/dL — ABNORMAL HIGH (ref 70–99)
Potassium: 3.6 mEq/L (ref 3.5–5.1)
Total Bilirubin: 0.2 mg/dL — ABNORMAL LOW (ref 0.3–1.2)
Total Protein: 7.2 g/dL (ref 6.0–8.3)

## 2012-06-17 LAB — GLUCOSE, CAPILLARY
Glucose-Capillary: 122 mg/dL — ABNORMAL HIGH (ref 70–99)
Glucose-Capillary: 106 mg/dL — ABNORMAL HIGH (ref 70–99)

## 2012-06-17 LAB — URINE MICROSCOPIC-ADD ON

## 2012-06-17 LAB — TROPONIN I: Troponin I: <0.3 ng/mL (ref <0.30)

## 2012-06-17 LAB — LIPASE, BLOOD: Lipase: 30 U/L (ref 11–59)

## 2012-06-17 LAB — LIPID PANEL: LDL Cholesterol: 60 mg/dL (ref 0–99)

## 2012-06-17 MED ORDER — SODIUM CHLORIDE 0.9 % IJ SOLN
3.0000 mL | Freq: Two times a day (BID) | INTRAMUSCULAR | Status: DC
  Administered 2012-06-17 (×2): 3 mL via INTRAVENOUS

## 2012-06-17 MED ORDER — ASPIRIN EC 81 MG PO TBEC
81.0000 mg | DELAYED_RELEASE_TABLET | Freq: Every day | ORAL | Status: DC
  Administered 2012-06-17 – 2012-06-18 (×2): 81 mg via ORAL
  Filled 2012-06-17 (×2): qty 1

## 2012-06-17 MED ORDER — TIMOLOL HEMIHYDRATE 0.5 % OP SOLN: 1.0000 [drp] | Freq: Two times a day (BID) | OPHTHALMIC | Status: DC

## 2012-06-17 MED ORDER — AZITHROMYCIN 500 MG PO TABS
500.0000 mg | ORAL_TABLET | Freq: Every day | ORAL | Status: AC
  Administered 2012-06-17: 500 mg via ORAL
  Filled 2012-06-17: qty 1

## 2012-06-17 MED ORDER — DOCUSATE SODIUM 100 MG PO CAPS
100.0000 mg | ORAL_CAPSULE | Freq: Two times a day (BID) | ORAL | Status: DC
  Administered 2012-06-17 – 2012-06-18 (×3): 100 mg via ORAL
  Filled 2012-06-17 (×4): qty 1

## 2012-06-17 MED ORDER — BISACODYL 5 MG PO TBEC
10.0000 mg | DELAYED_RELEASE_TABLET | Freq: Every day | ORAL | Status: DC | PRN
  Filled 2012-06-17: qty 2

## 2012-06-17 MED ORDER — PILOCARPINE HCL 2 % OP SOLN
1.0000 [drp] | Freq: Four times a day (QID) | OPHTHALMIC | Status: DC
  Administered 2012-06-17 – 2012-06-18 (×5): 1 [drp] via OPHTHALMIC
  Filled 2012-06-17: qty 15

## 2012-06-17 MED ORDER — DORZOLAMIDE HCL 2 % OP SOLN
1.0000 [drp] | Freq: Two times a day (BID) | OPHTHALMIC | Status: DC
  Administered 2012-06-17 – 2012-06-18 (×2): 1 [drp] via OPHTHALMIC
  Filled 2012-06-17: qty 10

## 2012-06-17 MED ORDER — INSULIN ASPART 100 UNIT/ML ~~LOC~~ SOLN
0.0000 [IU] | Freq: Three times a day (TID) | SUBCUTANEOUS | Status: DC
  Administered 2012-06-17: 1 [IU] via SUBCUTANEOUS

## 2012-06-17 MED ORDER — AZITHROMYCIN 250 MG PO TABS
250.0000 mg | ORAL_TABLET | Freq: Every day | ORAL | Status: DC
  Administered 2012-06-18: 250 mg via ORAL
  Filled 2012-06-17: qty 1

## 2012-06-17 MED ORDER — BIMATOPROST 0.03 % OP SOLN
1.0000 [drp] | Freq: Every day | OPHTHALMIC | Status: DC
  Administered 2012-06-17: 1 [drp] via OPHTHALMIC
  Filled 2012-06-17: qty 2.5

## 2012-06-17 MED ORDER — ENOXAPARIN SODIUM 40 MG/0.4ML ~~LOC~~ SOLN
40.0000 mg | SUBCUTANEOUS | Status: DC
  Administered 2012-06-17 – 2012-06-18 (×2): 40 mg via SUBCUTANEOUS
  Filled 2012-06-17 (×2): qty 0.4

## 2012-06-17 MED ORDER — TIMOLOL MALEATE 0.5 % OP SOLN
1.0000 [drp] | Freq: Two times a day (BID) | OPHTHALMIC | Status: DC
  Administered 2012-06-17 – 2012-06-18 (×2): 1 [drp] via OPHTHALMIC
  Filled 2012-06-17: qty 5

## 2012-06-17 NOTE — Progress Notes (Signed)
Utilization review completed.  

## 2012-06-17 NOTE — Progress Notes (Addendum)
[facility] PROGRESS NOTE  Carol Palmer GNF:[id number] DOB: 06/23/1941 DOA: 06/16/2012 PCP: Bebe Liter, NP  Assessment/Plan:  Patient Active Problem List  Diagnosis  . HEPATITIS C  . DIABETES MELLITUS, TYPE II  . UNSPECIFIED ANEMIA  . DELUSIONAL DISORDER  . GLAUCOMA NOS  . OTITIS EXTERNA, ACUTE NEC  . OTITIS MEDIA, ACUTE, BILATERAL  . MASTOIDITIS NOS  . ALLERGIC RHINITIS  . CELLULITIS, SCALP  . OSTEOARTHRITIS, KNEES, BILATERAL  . HX, PERSONAL, MENTAL DISORDER NOS  . Glaucoma  . Hepatitis  . Cellulitis and abscess of leg  . Chest pain  . Swelling of extremity  . Anemia  . Abdominal pain  . Atypical pneumonia   Chest pain, recurrent problem and improving.  Patient had a CT chest due to mildly elevated D-dimer two weeks ago with similar complaint, which was negative.  Patient is not Carol Palmer of breath, tachypneic, or hypoxic, but does have a possible infiltrate on CXR.  No evidence of COPD exacerbation on exam and difficult to tell if pain is reproducible today, but per previous exam CP was reproducible.  Her telemetry has shown no arrhythmias and her troponins are negative x 3.  Her initial ECG was normal sinus rhythm.   -  F/u ECHO -  Repeat ECG -  Hemoglobin a1c and lipid panel pending  Infiltrate on CXR.  Patient has some chest pain that may be related to infection, but her WBC count is normal and she is afebrile. Treat for atypical pneumonia.  -  Azithromycin 500mg  today and 250mg  x 4 days -  Incentive spirometry  Abdominal pain, new.  Unclear etiology and diffuse pain without rebound or guarding. -  LFTs - Lipase -  UA -  Bowel regimen  Swelling of the left lower extremity, new.  -  Awaiting duplex   Blood pressures low, patient on no blood pressure medications.   -  Continue to monitor  Diabetes, unclear if well-controlled.  Fingersticks have been in low 100s since admission. -  A1c -  SSI low dose  Hep C infection and Hep B, history of  infection.  LFTs stable.  Hepatology follow up.  Normocytic anemia, worsening.  No need for acute transfusion and no signs of active bleeding at this time. -  Trend hemoglobin  DIET:   Diabetic, healthy heart ACCESS:  PIV IVF:  OFF PROPH:  lovenox   Code Status:  Full code Family Communication: With patient Disposition Plan:  Pending completion of ECHO, work up for abdominal pain complete, tolerating diet, likely to home.     HPI: 71 yo female delusional disorder comes in with 2 days of reprod sscp the rle swelling. She was just d/c from hosp tx with lle cellulitis and had swelling in her lle had neg u/s of lle only then. Says her RLE has been swollen for over 3 months. Currently cp free. Denies sob. No redness to legs completed abx from last hosp. No fevrs. No cough. Pt unreliable historian and has many delusions particularly about being able to "hear" the bacteria that is in her bed and cough at home, this is chronic issue   Consultants:  None  Procedures:  ECHO 10/11  Venous duplex 10/11  Antibiotics:  Azithromycin 10/11>>  HPI/Subjective:  Complains of abdominal pain and chills.  Contniues to hurt all over, including chest pain, and states leg swelling is not better.    Objective: Filed Vitals:   06/16/12 2230 06/16/12 2243 06/16/12 2300 06/17/12 0500  BP: 105/55  126/82  92/62  Pulse: 84  79 72  Temp:  98 F (36.7 C) 98.3 F (36.8 C) 98 F (36.7 C)  TempSrc:   Oral Oral  Resp: 15  20 18   Height:   5\' 2"  (1.575 m)   Weight:   64.3 kg (141 lb 12.1 oz)   SpO2: 95%  96% 97%   No intake or output data in the 24 hours ending 06/17/12 1116 Filed Weights   06/16/12 2300  Weight: 64.3 kg (141 lb 12.1 oz)    Exam:   General:  AAF, no acute distress, lying under multiple covers   HEENT:  MMM, tongue midline  Cardiovascular: RRR, no m/r/g, 2+ pulses  Respiratory: Diminished BS at bases  Abdomen: NABS, diffusely tender to palpation without rebound or  guarding.    MSK:  2+ LLE swelling  Data Reviewed: Basic Metabolic Panel:  Lab 06/16/12 8295  NA 143  K 4.0  CL 105  CO2 27  GLUCOSE 99  BUN 11  CREATININE 0.72  CALCIUM 9.5  MG --  PHOS --   Liver Function Tests:  Lab 06/16/12 1832  AST 26  ALT 17  ALKPHOS 129*  BILITOT 0.2*  PROT 7.4  ALBUMIN 3.7   No results found for this basename: LIPASE:5,AMYLASE:5 in the last 168 hours No results found for this basename: AMMONIA:5 in the last 168 hours CBC:  Lab 06/17/12 0630 06/16/12 1832  WBC 8.0 10.8*  NEUTROABS -- --  HGB 10.8* 11.7*  HCT 33.7* 36.5  MCV 70.2* 70.7*  PLT 443* 454*   Cardiac Enzymes:  Lab 06/17/12 0630 06/17/12 0110  CKTOTAL -- --  CKMB -- --  CKMBINDEX -- --  TROPONINI <0.30 <0.30   BNP (last 3 results) No results found for this basename: PROBNP:3 in the last 8760 hours CBG:  Lab 06/17/12 0727  GLUCAP 110*    No results found for this or any previous visit (from the past 240 hour(s)).   Studies: Dg Chest 2 View  06/16/2012  *RADIOLOGY REPORT*  Clinical Data: Chest pain and shortness of breath for 2 days  CHEST - 2 VIEW  Comparison: 07/03/2011; 06/11/2011; chest CT - 06/04/2012  Findings:  Grossly unchanged cardiac silhouette and mediastinal contours given patient rotation to the left.  Worsening bibasilar heterogeneous opacities, left greater than right.  The lungs remain hyperexpanded with flattening of the bilateral hemidiaphragms.  No pleural effusion or pneumothorax.  Unchanged bones including scoliotic curvature of the thoracolumbar spine.  Degenerative change of the right glenohumeral joint.  IMPRESSION:  Worsened bibasilar heterogeneous opacities, favored to represent atelectasis though developing infection not excluded.   Original Report Authenticated By: Waynard Reeds, M.D.     Scheduled Meds:    . aspirin  324 mg Oral Once  . aspirin EC  81 mg Oral Daily  . azithromycin  500 mg Oral Daily   Followed by  . azithromycin  250  mg Oral Daily  . bimatoprost  1 drop Left Eye QHS  . dorzolamide  1 drop Left Eye BID  . enoxaparin (LOVENOX) injection  40 mg Subcutaneous Q24H  . pilocarpine  1 drop Left Eye QID  . sodium chloride  3 mL Intravenous Q12H  . timolol  1 drop Left Eye BID  . DISCONTD: timolol  1 drop Left Eye BID   Continuous Infusions:   Principal Problem:  *Chest pain Active Problems:  HEPATITIS C  DIABETES MELLITUS, TYPE II  DELUSIONAL DISORDER  Swelling of extremity  Anemia  Abdominal pain  Atypical pneumonia    Time spent: 30    Carol Palmer, [facility]  [facility] Pager [phone]. If 8PM-8AM, please contact night-coverage at [email or web address], password [facility] 06/17/2012, 11:16 AM  LOS: 1 day

## 2012-06-17 NOTE — Progress Notes (Signed)
  Echocardiogram 2D Echocardiogram has been performed.  Carol Palmer 06/17/2012, 10:54 AM

## 2012-06-18 DIAGNOSIS — M79609 Pain in unspecified limb: Secondary | ICD-10-CM

## 2012-06-18 DIAGNOSIS — M7989 Other specified soft tissue disorders: Secondary | ICD-10-CM

## 2012-06-18 LAB — GLUCOSE, CAPILLARY: Glucose-Capillary: 149 mg/dL — ABNORMAL HIGH (ref 70–99)

## 2012-06-18 LAB — BASIC METABOLIC PANEL
BUN: 13 mg/dL (ref 6–23)
Calcium: 9.4 mg/dL (ref 8.4–10.5)
Creatinine, Ser: 0.61 mg/dL (ref 0.50–1.10)
GFR calc Af Amer: >90 mL/min (ref >90)
GFR calc non Af Amer: 90 mL/min — ABNORMAL LOW (ref >90)

## 2012-06-18 LAB — CBC
HCT: 34.8 % — ABNORMAL LOW (ref 36.0–46.0)
MCH: 22.9 pg — ABNORMAL LOW (ref 26.0–34.0)
MCHC: 32.2 g/dL (ref 30.0–36.0)
MCV: 71.2 fL — ABNORMAL LOW (ref 78.0–100.0)
RDW: 13.3 % (ref 11.5–15.5)

## 2012-06-18 MED ORDER — AZITHROMYCIN 250 MG PO TABS: 250.0000 mg | ORAL_TABLET | Freq: Every day | ORAL | Status: DC

## 2012-06-18 NOTE — Progress Notes (Signed)
Ultrasound paged in rel;ation to a Stat order for BLE ultrasound by Dr. Malachi Bonds. Awaiting for response. Ancil Linsey RN

## 2012-06-18 NOTE — Progress Notes (Signed)
Re- paged [place] dept at [phone] - [phone]. Continue to await for response. Ancil Linsey RN

## 2012-06-18 NOTE — Discharge Summary (Signed)
Physician Discharge Summary  Carol Palmer UJW:[id number] DOB: 04-24-1941 DOA: 06/16/2012  PCP: Bebe Liter, NP  Admit date: 06/16/2012 Discharge date: 06/18/2012  Recommendations for Outpatient Follow-up:  1. Follow up with primary care doctor within one week for repeat CBC, anemia work up with not already complete, and thrombocytosis work up if this does not resolve after acute infection resolved.    Discharge Diagnoses:  Principal Problem:  *Chest pain Active Problems:  HEPATITIS C  DIABETES MELLITUS, TYPE II  DELUSIONAL DISORDER  Swelling of extremity  Anemia  Abdominal pain  Atypical pneumonia   Discharge Condition: stable, improved  Diet recommendation: carbohydrate controlled, healthy heart  Wt Readings from Last 3 Encounters:  06/16/12 64.3 kg (141 lb 12.1 oz)  06/06/12 65.8 kg (145 lb 1 oz)  11/24/11 62.596 kg (138 lb)    History of present illness:   71 yo female delusional disorder comes in with 2 days of reprod sscp the rle swelling. She was just d/c from hosp tx with lle cellulitis and had swelling in her lle had neg u/s of lle only then. Says her RLE has been swollen for over 3 months. Currently cp free. Denies sob. No redness to legs completed abx from last hosp. No fevrs. No cough. Pt unreliable historian and has many delusions particularly about being able to "hear" the bacteria that is in her bed and cough at home, this is chronic issue.   Hospital Course:   Chest pain, recurrent problem and improving. Patient had a CT chest due to mildly elevated D-dimer two weeks ago with similar complaint, which was negative. Patient is not Carol Palmer of breath, tachypneic, or hypoxic, but does have a possible infiltrate on CXR. No evidence of COPD exacerbation on exam and difficult to tell if pain is reproducible today, but per previous exam CP was reproducible. Her telemetry has shown no arrhythmias and her troponins are negative x 3. Her initial ECG was normal sinus  rhythm and stable.  Her ECHO demonstrated a preserved ejection fraction with mild MR, moderately dilated LA, mildly dilated RA.  Her lipid panel showed her lipids were at goal (see below), and her hemoglobin A1c was decreased from prior, currently 5.8.  She received education regarding healthy diet and exercise.  Her framingham risk is 8% (<10%) over 10 years so she was not continued on aspirin at discharge.    Lab Results  Component Value Date   CHOL 114 06/17/2012   HDL 45 06/17/2012   LDLCALC 60 06/17/2012   LDLDIRECT 142.0 07/20/2007   TRIG 43 06/17/2012   CHOLHDL 2.5 06/17/2012     Atypical pneumonia:  Infiltrate on CXR with mild leukocytosis to 10.8. Patient has some chest pain that may be related to infection, but her WBC count is normal and she is afebrile.  She states that she has had increased cough productive of thick yellow sputum.  She was started on azithromycin on 10/11 to complete a five day course and she clinically improved and her WBC count trended down.  She was also given an incentive spirometer.    Abdominal pain developed on 10/11, however, her LFTs were normal except for a mildly elevated alk phos at 129 and her lipase was within normal limits.  Her urinalysis showed trace LE, small hemoglobin, but only 0-2 WBC.    Swelling of the left lower extremity, new.  Her venous duplex was negative for DVT and the swelling resolved spontaneously.    Blood pressures low, patient on no blood pressure  medications improved spontaneously.     Diabetes, well controlled and resolved.  Her hemoglobin A1c is 5.8.  She was counseled about healthy eating and exercise and should follow up with her primary care doctor for further surveillance.     Microcytic anemia, worsening. No need for acute transfusion and no signs of active bleeding.  Her hemoglobin remained stable around 11mg /dl.  Her platelets were also mildly elevated.  She should have a work up for anemia (if not already complete) by  her primary care doctor and further evaluation for mild thrombocytosis if it does not resolve after treatment for mild pneumonia.  Hep C infection and Hep B, history of infection. LFTs stable. PCP to refer for hepatology.    Procedures:  2D-ECHO 10/11  Vascular duplex 10/12  Consultations:  None  Discharge Exam: Filed Vitals:   06/18/12 1400  BP: 115/75  Pulse: 72  Temp: 98.1 F (36.7 C)  Resp: 18   Filed Vitals:   06/17/12 1400 06/17/12 2100 06/18/12 0500 06/18/12 1400  BP: 99/58 140/82 128/85 115/75  Pulse: 76 69 66 72  Temp: 98.3 F (36.8 C) 97.6 F (36.4 C) 97.8 F (36.6 C) 98.1 F (36.7 C)  TempSrc:  Axillary Oral   Resp: 18   18  Height:      Weight:      SpO2: 96% 99% 100% 99%    General: AAF, no acute distress, sitting up in bed, conversant HEENT: MMM, tongue midline  Cardiovascular: RRR, no m/r/g, 2+ pulses  Respiratory:  CTAB Abdomen: NABS, diffusely tender to palpation without rebound or guarding.  MSK:  Right ankle mildly swollen and tender with trace edema.  Left leg swelling completely resolved.     Discharge Instructions      Discharge Orders    Future Appointments: Provider: Department: Dept Phone: Center:   06/21/2012 2:00 PM [facility] [phone] [facility]   06/21/2012 3:00 PM Larina Earthly, MD [facility] [phone] [facility]     Future Orders Please Complete By Expires   Diet - low sodium heart healthy      Increase activity slowly      Discharge instructions      Comments:   You were hospitalized with chest pain and left leg swelling.  You did not have a heart attack.  Your blood tests for heart attack were negative.  An ultrasound of your heart showed that your heart was squeezing well.  Your cholesterol is very good.  You are at risk for developing diabetes, however, and you were given information about eating healthily.  Please exercise regularly.  Your chest x-ray showed a possible pneumonia and you had some cough.  You  were started on azithromycin, an antibiotic that you will need to continue to take at home for three more days.  Take your next dose on Sunday, 10/13.  Ultrasound of your leg showed no blood clots and the swelling improved on its own.  Please follow up with your primary care doctor within one week for repeat blood work.  Please talk to your primary care doctor about your anemia.   Call MD for:      Comments:   Call 911 if you have chest pain, shortness of breath, slurred speech, confusion, facial droop, numbness or weakness of an arm or leg.   Call MD for:  temperature >100.4      Call MD for:  persistant nausea and vomiting      Call MD for:  severe  uncontrolled pain      Call MD for:  difficulty breathing, headache or visual disturbances      Call MD for:  hives      Call MD for:  persistant dizziness or light-headedness      Call MD for:  extreme fatigue          Medication List     As of 06/18/2012  4:22 PM    TAKE these medications         ALKA-SELTZER ANTI-GAS PO   Take 1 tablet by mouth daily as needed. For gas      azithromycin 250 MG tablet   Commonly known as: ZITHROMAX   Take 1 tablet (250 mg total) by mouth daily. First dose on 10/13      bimatoprost 0.03 % ophthalmic solution   Commonly known as: LUMIGAN   Place 1 drop into the left eye at bedtime.      dorzolamide 2 % ophthalmic solution   Commonly known as: TRUSOPT   Place 1 drop into the left eye 2 (two) times daily.      multivitamin with minerals Tabs   Take 1 tablet by mouth daily.      naproxen sodium 220 MG tablet   Commonly known as: ANAPROX   Take 220 mg by mouth 2 (two) times daily as needed. For headache/pain      pilocarpine 2 % ophthalmic solution   Commonly known as: PILOCAR   Place 1 drop into the left eye 4 (four) times daily.      timolol 0.5 % ophthalmic solution   Commonly known as: BETIMOL   Place 1 drop into the left eye 2 (two) times daily.        Follow-up Information     Follow up with ANTHONY,ARENNETTE, NP. Schedule an appointment as soon as possible for a visit in 1 week.   Contact information:   [address] [place] [address] [phone]           The results of significant diagnostics from this hospitalization (including imaging, microbiology, ancillary and laboratory) are listed below for reference.    Significant Diagnostic Studies: Dg Chest 2 View  06/16/2012  *RADIOLOGY REPORT*  Clinical Data: Chest pain and shortness of breath for 2 days  CHEST - 2 VIEW  Comparison: 07/03/2011; 06/11/2011; chest CT - 06/04/2012  Findings:  Grossly unchanged cardiac silhouette and mediastinal contours given patient rotation to the left.  Worsening bibasilar heterogeneous opacities, left greater than right.  The lungs remain hyperexpanded with flattening of the bilateral hemidiaphragms.  No pleural effusion or pneumothorax.  Unchanged bones including scoliotic curvature of the thoracolumbar spine.  Degenerative change of the right glenohumeral joint.  IMPRESSION:  Worsened bibasilar heterogeneous opacities, favored to represent atelectasis though developing infection not excluded.   Original Report Authenticated By: Waynard Reeds, M.D.    Dg Ankle Complete Left  06/04/2012  *RADIOLOGY REPORT*  Clinical Data: Left ankle and leg swelling.  LEFT ANKLE COMPLETE - 3+ VIEW  Comparison: 07/04/2005  Findings: Three views of the left ankle are negative for acute fracture or dislocation.  Suspect degenerative changes at the first MTP joint.  IMPRESSION: No acute bony abnormality in the left ankle.   Original Report Authenticated By: Richarda Overlie, M.D.    Ct Angio Chest Pe W/cm &/or Wo Cm  06/04/2012  *RADIOLOGY REPORT*  Clinical Data: Chest pain, shortness of breath and elevated D- dimer.  CT ANGIOGRAPHY CHEST  Technique:  Multidetector CT imaging of the chest using the standard protocol during bolus administration of intravenous contrast. Multiplanar  reconstructed images including MIPs were obtained and reviewed to evaluate the vascular anatomy.  Contrast: 80mL OMNIPAQUE IOHEXOL 350 MG/ML SOLN  Comparison: Chest radiograph 07/03/2011  Findings: No evidence for pulmonary embolism.  Small calcifications in the proximal left anterior descending coronary artery.  There is no significant pericardial or pleural fluid.   No acute abnormalities in the visualized upper abdomen.  There is a large amount of stool within the visualized colon.  The patient also has a moderate sized hiatal hernia.  No significant chest lymphadenopathy.  Trachea and mainstem bronchi are patent.  Emphysematous changes in the upper lungs.  There are subtle patchy nodular and ground-glass densities at the base of the medial right middle lobe.  Small nodular area on sequence 602, image 29.  No acute bony abnormality.  IMPRESSION: No evidence for pulmonary embolism.  Emphysematous changes.  Subtle patchy nodular densities at the base of the right middle lobe are nonspecific.  This could represent atelectasis or small focus of infection or inflammation.  Due to the small nodules, recommend a 3-6 month follow-up CT to ensure resolution.  Hiatal hernia.   Original Report Authenticated By: Richarda Overlie, M.D.     Microbiology: No results found for this or any previous visit (from the past 240 hour(s)).   Labs: Basic Metabolic Panel:  Lab 06/18/12 6295 06/17/12 1200 06/16/12 1832  NA 143 139 143  K 4.1 3.6 4.0  CL 105 105 105  CO2 29 25 27   GLUCOSE 90 123* 99  BUN 13 13 11   CREATININE 0.61 0.53 0.72  CALCIUM 9.4 9.2 9.5  MG -- -- --  PHOS -- -- --   Liver Function Tests:  Lab 06/17/12 1200 06/16/12 1832  AST 19 26  ALT 10 17  ALKPHOS 61 129*  BILITOT 0.2* 0.2*  PROT 7.2 7.4  ALBUMIN 2.6* 3.7    Lab 06/17/12 1200  LIPASE 30  AMYLASE --   No results found for this basename: AMMONIA:5 in the last 168 hours CBC:  Lab 06/18/12 0502 06/17/12 0630 06/16/12 1832  WBC 6.1 8.0  10.8*  NEUTROABS -- -- --  HGB 11.2* 10.8* 11.7*  HCT 34.8* 33.7* 36.5  MCV 71.2* 70.2* 70.7*  PLT 493* 443* 454*   Cardiac Enzymes:  Lab 06/17/12 1200 06/17/12 0630 06/17/12 0110  CKTOTAL -- -- --  CKMB -- -- --  CKMBINDEX -- -- --  TROPONINI <0.30 <0.30 <0.30   BNP: BNP (last 3 results) No results found for this basename: PROBNP:3 in the last 8760 hours CBG:  Lab 06/18/12 0833 06/17/12 2113 06/17/12 1642 06/17/12 1125 06/17/12 0727  GLUCAP 149* 106* 100* 122* 110*    Time coordinating discharge: 45 minutes  Signed:  Jenetta Wease  [facility] 06/18/2012, 4:22 PM

## 2012-06-18 NOTE — Progress Notes (Signed)
VASCULAR LAB PRELIMINARY  PRELIMINARY  PRELIMINARY  PRELIMINARY  Bilateral lower extremity venous Dopplers completed.    Preliminary report:  There is no DVT or SVT noted in the bilateral lower extremities.  Reneisha Stilley, 06/18/2012, 3:46 PM

## 2012-06-20 ENCOUNTER — Encounter: Payer: Self-pay | Admitting: Vascular Surgery

## 2012-06-21 ENCOUNTER — Ambulatory Visit (INDEPENDENT_AMBULATORY_CARE_PROVIDER_SITE_OTHER): Payer: Medicare Other | Admitting: Vascular Surgery

## 2012-06-21 ENCOUNTER — Encounter: Payer: Self-pay | Admitting: Vascular Surgery

## 2012-06-21 ENCOUNTER — Encounter (INDEPENDENT_AMBULATORY_CARE_PROVIDER_SITE_OTHER): Payer: Medicare Other | Admitting: *Deleted

## 2012-06-21 VITALS — BP 163/88 | HR 64 | Resp 20 | Ht 62.0 in | Wt 140.0 lb

## 2012-06-21 DIAGNOSIS — M7989 Other specified soft tissue disorders: Secondary | ICD-10-CM | POA: Insufficient documentation

## 2012-06-21 NOTE — Progress Notes (Signed)
[facility] [place]   Patient name: Carol Palmer MRN: [id number] DOB: Apr 24, 1941 Sex: female     Reason for referral:  Chief Complaint  Patient presents with  . New Evaluation    B/L  LOWER EXTREMITY SWELLING    HISTORY OF PRESENT ILLNESS: The patient presents today for evaluation of bilateral lower extremity swelling, right greater than left. She seems to have very poor understanding of her health issues. After some searching she feels that this may have been present for several months. Her medical record indicates she has been placed on Lasix for this but she denies diuretic treatment. She also reports some swelling in her shoulders and some diffuse abdominal discomfort. She denies any history of prior DVT. She does report some diffuse fatigue including her lower extremities.  Past Medical History  Diagnosis Date  . Glaucoma(365)   . Hepatitis     HEP C  . Ear infection   . Hard of hearing   . Rheumatoid arthritis     Past Surgical History  Procedure Date  . Inner ear surgery   . Eye surgery     History   Social History  . Marital Status: Widowed    Spouse Name: N/A    Number of Children: N/A  . Years of Education: N/A   Occupational History  . Not on file.   Social History Main Topics  . Smoking status: Never Smoker   . Smokeless tobacco: Never Used  . Alcohol Use: No  . Drug Use: No  . Sexually Active: Not Currently   Other Topics Concern  . Not on file   Social History Narrative  . No narrative on file    No family history on file.  Allergies as of 06/21/2012 - Review Complete 06/21/2012  Allergen Reaction Noted  . Fish allergy  06/17/2012    Current Outpatient Prescriptions on File Prior to Visit  Medication Sig Dispense Refill  . azithromycin (ZITHROMAX) 250 MG tablet Take 1 tablet (250 mg total) by mouth daily. First dose on 10/13  3 each  0  . bimatoprost (LUMIGAN) 0.03 % ophthalmic solution Place 1 drop into the  left eye at bedtime.      . dorzolamide (TRUSOPT) 2 % ophthalmic solution Place 1 drop into the left eye 2 (two) times daily.       . Multiple Vitamin (MULTIVITAMIN WITH MINERALS) TABS Take 1 tablet by mouth daily.      . naproxen sodium (ANAPROX) 220 MG tablet Take 220 mg by mouth 2 (two) times daily as needed. For headache/pain      . pilocarpine (PILOCAR) 2 % ophthalmic solution Place 1 drop into the left eye 4 (four) times daily.      . Simethicone (ALKA-SELTZER ANTI-GAS PO) Take 1 tablet by mouth daily as needed. For gas      . timolol (BETIMOL) 0.5 % ophthalmic solution Place 1 drop into the left eye 2 (two) times daily.       Marland Kitchen DISCONTD: lansoprazole (PREVACID) 30 MG capsule Take 30 mg by mouth every 12 (twelve) hours. Take 1 cap by mouth every 12 hours before meals in combination with amoxicillin and clarithromycin.          REVIEW OF SYSTEMS:  Positives indicated with an "X"  CARDIOVASCULAR:  [ ]  chest pain   [ ]  chest pressure   [ ]  palpitations   [ ]  orthopnea   [ ]  dyspnea on exertion   [ ]   claudication   [ ]  rest pain   [ ]  DVT   [ ]  phlebitis PULMONARY:   [ ]  productive cough   [ ]  asthma   [ ]  wheezing NEUROLOGIC:   [ ]  weakness  [ ]  paresthesias  [ ]  aphasia  [ ]  amaurosis  [ ]  dizziness HEMATOLOGIC:   [ ]  bleeding problems   [ ]  clotting disorders MUSCULOSKELETAL:  [ ]  joint pain   [ ]  joint swelling GASTROINTESTINAL: [ ]   blood in stool  [ ]   hematemesis GENITOURINARY:  [ ]   dysuria  [ ]   hematuria PSYCHIATRIC:  [ ]  history of major depression INTEGUMENTARY:  [ ]  rashes  [ ]  ulcers CONSTITUTIONAL:  [ ]  fever   [ ]  chills  PHYSICAL EXAMINATION:  General: The patient is a well-nourished female, in no acute distress. Vital signs are BP 163/88  Pulse 64  Resp 20  Ht 5\' 2"  (1.575 m)  Wt 140 lb (63.504 kg)  BMI 25.61 kg/m2 Pulmonary: There is a good air exchange bilaterally without wheezing or rales. Abdomen: Soft and non-tender with normal pitch bowel sounds. No  masses noted Musculoskeletal: There are no major deformities.  There is no significant extremity pain. Neurologic: No focal weakness or paresthesias are detected, Skin: There are no ulcer or rashes noted. No changes of chronic venous hypertension Psychiatric: The patient has normal affect. Cardiovascular: There is a regular rate and rhythm without significant murmur appreciated. Pulse status: 2- 3+ dorsalis pedis pulses bilaterally No significant lower surety swelling currently [facility] Vascular Lab Studies:  Ordered and Independently Reviewed no evidence of DVT or other venous pathology in the left lower surety. On the right I reviewed the films. 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 The veins below this level are normal.   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 I discussed this with the patient. The do not feel she has any indication for anticoagulation since this does appear to be 2 chronic with collateral formation. She was reassured this discussion. She will continue elevation and discuss diuresis with her medical physician    Detrich Rakestraw [facility] [place] Office: [phone]

## 2012-06-22 ENCOUNTER — Telehealth: Payer: Self-pay | Admitting: Vascular Surgery

## 2012-06-22 NOTE — Telephone Encounter (Signed)
Patient was in office 06/21/12 for venous reflux u/s and new visit with Dr Arbie Cookey. Office notes are in [facility] regarding appointment. Patient called in today 06/22/12 stating that she wanted to schedule an appointment with Dr Early to go over her test results. I asked if she did indeed see Dr Early after her lab, because our records indicate that he went over the results. Patient stated that she did see him, but the room was so freezing cold, she didn't hear a thing he said. She had been hospitalized for pneumonia previously and was worried about being in such a cold room.   After speaking with our Clinical Manager, Darel Hong, I rescheduled the office visit. I did explain that there was a possibility that we would file the visit with the insurance again. She became concerned that Medicaid may not pay. I told her that I could ask our office manager, but she said it was not necessary. Our phone call ended with her scheduled for 07/13/12 for a repeat visit about the results of her lab.  Patient called back about 10 minutes later stating that she did not get a complete exam yesterday, and that is why she wanted to reschedule the appointment. She stated that Dr Arbie Cookey said he would examine her, and he did not. I asked if she brought this up at the time of her visit, and she said "no, I didn't hear what the doctor said." (? ?)  So, again, I told her that I had her scheduled, and that I could get our office manager to talk with her regarding the insurance issue, and she stated "No, that is not necessary, and Princess, if Medicaid doesn't pay, I will." So with that, she became a little agitated and said just schedule me and leave it at that.    I will route this to Dr Arbie Cookey and Fabio Neighbors

## 2012-07-12 ENCOUNTER — Ambulatory Visit: Payer: Medicare Other | Admitting: Vascular Surgery

## 2012-08-20 ENCOUNTER — Emergency Department (HOSPITAL_COMMUNITY)
Admission: EM | Admit: 2012-08-20 | Discharge: 2012-08-20 | Disposition: A | Discharge status: Home / Self Care | Payer: Medicare Other | Attending: Emergency Medicine | Admitting: Emergency Medicine

## 2012-08-20 ENCOUNTER — Encounter (HOSPITAL_COMMUNITY): Payer: Self-pay | Admitting: Family Medicine

## 2012-08-20 DIAGNOSIS — Z87828 Personal history of other (healed) physical injury and trauma: Secondary | ICD-10-CM | POA: Insufficient documentation

## 2012-08-20 DIAGNOSIS — Z79899 Other long term (current) drug therapy: Secondary | ICD-10-CM | POA: Insufficient documentation

## 2012-08-20 DIAGNOSIS — M79673 Pain in unspecified foot: Secondary | ICD-10-CM

## 2012-08-20 DIAGNOSIS — M79609 Pain in unspecified limb: Secondary | ICD-10-CM | POA: Insufficient documentation

## 2012-08-20 DIAGNOSIS — Z8669 Personal history of other diseases of the nervous system and sense organs: Secondary | ICD-10-CM | POA: Insufficient documentation

## 2012-08-20 DIAGNOSIS — Z8619 Personal history of other infectious and parasitic diseases: Secondary | ICD-10-CM | POA: Insufficient documentation

## 2012-08-20 DIAGNOSIS — Z8739 Personal history of other diseases of the musculoskeletal system and connective tissue: Secondary | ICD-10-CM | POA: Insufficient documentation

## 2012-08-20 MED ORDER — ACETAMINOPHEN 325 MG PO TABS
650.0000 mg | ORAL_TABLET | Freq: Once | ORAL | Status: DC
  Filled 2012-08-20: qty 2

## 2012-08-20 NOTE — Discharge Instructions (Signed)
Take tylenol/advil as need. Follow up with your primary care doctor in the next few days for recheck. Return to ER if worse, fevers, swelling, redness, other concern.

## 2012-08-20 NOTE — ED Notes (Signed)
Pt c/o pain in left foot shooting up into her knee. Pt reports it happened on Thursday and states that there is a rod in her foot that goes all the way up into her knee. Pt denies stepping on anything. Pt's foot has no obvious injury.

## 2012-08-20 NOTE — ED Notes (Signed)
Per pt something entered her foot ( a rod)  and went up into her leg and is very painful

## 2012-08-20 NOTE — ED Provider Notes (Signed)
History  Scribed for Suzi Roots, MD, the patient was seen in room TR11C/TR11C. This chart was scribed by Candelaria Stagers. The patient's care started at 7:31 PM   CSN: [id number]  Arrival date & time 08/20/12  9629   First MD Initiated Contact with Patient 08/20/12 1923      Chief Complaint  Patient presents with  . Foot Pain     The history is provided by the patient. The history is limited by the condition of the patient. No language interpreter was used.   Carol Palmer is a 71 y.o. female who presents to the Emergency Department complaining of left foot pain, c/o small area pain on plantar aspect of distal foot. Constant. Dull. Moderate.   Pt states a rod went into foot several yrs ago and thinks it is still in there.  Pt with hx similar symptoms for months/years. Denies recent injury. No trauma or fall. Is ambulatory on foot. No redness or swelling. No numbness. No fever or chills. No leg swelling or calf pain. No claudication.        Past Medical History  Diagnosis Date  . Glaucoma(365)   . Hepatitis     HEP C  . Ear infection   . Hard of hearing   . Rheumatoid arthritis     Past Surgical History  Procedure Date  . Inner ear surgery   . Eye surgery     History reviewed. No pertinent family history.  History  Substance Use Topics  . Smoking status: Never Smoker   . Smokeless tobacco: Never Used  . Alcohol Use: No    OB History    Grav Para Term Preterm Abortions TAB SAB Ect Mult Living                  Review of Systems  Unable to perform ROS: Other  Constitutional: Negative for fever and chills.  Musculoskeletal: Positive for arthralgias (bilateral leg and foot pain).  Neurological: Negative for weakness and numbness.  All other systems reviewed and are negative.    Allergies  Fish allergy  Home Medications   Current Outpatient Rx  Name  Route  Sig  Dispense  Refill  . BIMATOPROST 0.03 % OP SOLN   Left Eye   Place 1 drop into the  left eye at bedtime.         Marland Kitchen CALCIUM 600 + D PO   Oral   Take 1 tablet by mouth 2 (two) times daily.         Marland Kitchen VITAMIN D-3 5000 UNITS PO TABS   Oral   Take 2 tablets by mouth daily.         . DORZOLAMIDE HCL 2 % OP SOLN   Left Eye   Place 1 drop into the left eye 2 (two) times daily.          Marland Kitchen PILOCARPINE HCL 2 % OP SOLN   Left Eye   Place 1 drop into the left eye 4 (four) times daily.         Marland Kitchen PROBIOTIC DAILY PO   Oral   Take 1 tablet by mouth daily.         Marland Kitchen TIMOLOL HEMIHYDRATE 0.5 % OP SOLN   Left Eye   Place 1 drop into the left eye 2 (two) times daily.            BP 110/93  Pulse 80  Temp 98 F (36.7 C) (Oral)  Resp 22  SpO2 96%  Physical Exam  Nursing note and vitals reviewed. Constitutional: She appears well-developed and well-nourished. No distress.  HENT:  Head: Normocephalic and atraumatic.  Eyes: Conjunctivae normal are normal.  Neck: Neck supple. No tracheal deviation present.  Cardiovascular: Normal rate and intact distal pulses.   Pulmonary/Chest: Effort normal. No respiratory distress.  Musculoskeletal: Normal range of motion. She exhibits no edema.       bil feet without swelling or focal bony tenderness. C/o left distal foot pain in small area proximal to toes 2-4. Skin intact. No erythema. No sts. No fluctuance. Dp/pt palp. Normal cap refill distally. No leg swelling. No calf pain or tenderness. Normal rom at ankle and toes.     Neurological: She is alert.       Able to plantar and dorsiflex foot and move all toes.   Skin: Skin is warm and dry.  Psychiatric: She has a normal mood and affect.    ED Course  Procedures   DIAGNOSTIC STUDIES: Oxygen Saturation is 96% on room air, normal by my interpretation.    COORDINATION OF CARE:        MDM  I personally performed the services described in this documentation, which was scribed in my presence. The recorded information has been reviewed and is accurate.  Reviewed  nursing notes and prior charts for additional history.   No recent injury. C/o left distal foot pain localized to small area on plantar aspect foot - states a rod had gone in there in the remote past. No recent injury. No sts. No redness. Skin intact. Distal pulses palp w normal cap refill in toes.   Pt appears stable for d/c. To f/u w pcp in coming week.        Suzi Roots, MD 08/20/12 [phone]

## 2012-08-22 ENCOUNTER — Telehealth: Payer: Self-pay | Admitting: Gastroenterology

## 2012-08-22 ENCOUNTER — Encounter: Payer: Self-pay | Admitting: Gastroenterology

## 2012-08-22 NOTE — Telephone Encounter (Signed)
Pt could not come tomorrow because she has to give 24 hrs notice; r/s her to 08/25/12.

## 2012-08-25 ENCOUNTER — Ambulatory Visit: Payer: Medicare Other | Admitting: Gastroenterology

## 2012-08-25 ENCOUNTER — Ambulatory Visit (INDEPENDENT_AMBULATORY_CARE_PROVIDER_SITE_OTHER): Payer: Medicare Other | Admitting: Gastroenterology

## 2012-08-25 ENCOUNTER — Encounter: Payer: Self-pay | Admitting: Gastroenterology

## 2012-08-25 ENCOUNTER — Ambulatory Visit: Payer: Medicare Other

## 2012-08-25 VITALS — BP 142/80 | HR 56 | Ht 59.75 in | Wt 150.2 lb

## 2012-08-25 DIAGNOSIS — B1920 Unspecified viral hepatitis C without hepatic coma: Secondary | ICD-10-CM

## 2012-08-25 DIAGNOSIS — R600 Localized edema: Secondary | ICD-10-CM

## 2012-08-25 DIAGNOSIS — K219 Gastro-esophageal reflux disease without esophagitis: Secondary | ICD-10-CM

## 2012-08-25 DIAGNOSIS — R195 Other fecal abnormalities: Secondary | ICD-10-CM

## 2012-08-25 DIAGNOSIS — R609 Edema, unspecified: Secondary | ICD-10-CM

## 2012-08-25 LAB — IBC PANEL
Iron: 72 ug/dL (ref 42–145)
Saturation Ratios: 18.9 % — ABNORMAL LOW (ref 20.0–50.0)
Transferrin: 271.7 mg/dL (ref 212.0–360.0)

## 2012-08-25 LAB — HEPATIC FUNCTION PANEL
Albumin: 3.5 g/dL (ref 3.5–5.2)
Alkaline Phosphatase: 87 U/L (ref 39–117)
Bilirubin, Direct: 0 mg/dL (ref 0.0–0.3)

## 2012-08-25 LAB — PROTIME-INR
INR: 1.2 ratio — ABNORMAL HIGH (ref 0.8–1.0)
Prothrombin Time: 12.2 s (ref 10.2–12.4)

## 2012-08-25 LAB — FERRITIN: Ferritin: 87.5 ng/mL (ref 10.0–291.0)

## 2012-08-25 LAB — VITAMIN B12: Vitamin B-12: 529 pg/mL (ref 211–911)

## 2012-08-25 LAB — AMMONIA: Ammonia: 16 umol/L (ref 11–35)

## 2012-08-25 MED ORDER — HYOSCYAMINE SULFATE 0.125 MG SL SUBL: 0.1250 mg | SUBLINGUAL_TABLET | SUBLINGUAL | Status: DC | PRN

## 2012-08-25 MED ORDER — SPIRONOLACTONE 25 MG PO TABS: 25.0000 mg | ORAL_TABLET | Freq: Every day | ORAL | Status: DC

## 2012-08-25 MED ORDER — ESOMEPRAZOLE MAGNESIUM 40 MG PO CPDR: 40.0000 mg | DELAYED_RELEASE_CAPSULE | Freq: Every day | ORAL | Status: AC

## 2012-08-25 MED ORDER — MOVIPREP 100 G PO SOLR: 1.0000 | Freq: Once | ORAL | Status: DC

## 2012-08-25 NOTE — Patient Instructions (Addendum)
You have been scheduled for an endoscopy and colonoscopy with propofol on1-02-2013. Please follow the written instructions given to you at your visit today. Please pick up your prep at the pharmacy within the next 1-3 days. If you use inhalers (even only as needed) or a CPAP machine, please bring them with you on the day of your procedure.  Your physician has requested that you go to the basement for lab work before leaving today  We have sent the following medications to your pharmacy for you to pick up at your convenience: Aldactone, Nexium, and Levsin, please take as directed.   You have been scheduled for an abdominal ultrasound at [facility]iology (1st floor of hospital) on 09-01-2012 at 9:30 AM. Please arrive 15 minutes prior to your appointment for registration. Make certain not to have anything to eat or drink 6 hours prior to your appointment. Should you need to reschedule your appointment, please contact radiology at [phone]. This test typically takes about 30 minutes to perform. _____________________________________________________________________________________________________________________  Colonoscopy A colonoscopy is an exam to evaluate your entire colon. In this exam, your colon is cleansed. A long fiberoptic tube is inserted through your rectum and into your colon. The fiberoptic scope (endoscope) is a long bundle of enclosed and very flexible fibers. These fibers transmit light to the area examined and send images from that area to your caregiver. Discomfort is usually minimal. You may be given a drug to help you sleep (sedative) during or prior to the procedure. This exam helps to detect lumps (tumors), polyps, inflammation, and areas of bleeding. Your caregiver may also take a small piece of tissue (biopsy) that will be examined under a microscope. LET YOUR CAREGIVER KNOW ABOUT:   Allergies to food or medicine.  Medicines taken, including vitamins, herbs, eyedrops,  over-the-counter medicines, and creams.  Use of steroids (by mouth or creams).  Previous problems with anesthetics or numbing medicines.  History of bleeding problems or blood clots.  Previous surgery.  Other health problems, including diabetes and kidney problems.  Possibility of pregnancy, if this applies. BEFORE THE PROCEDURE   A clear liquid diet may be required for 2 days before the exam.  Ask your caregiver about changing or stopping your regular medications.  Liquid injections (enemas) or laxatives may be required.  A large amount of electrolyte solution may be given to you to drink over a short period of time. This solution is used to clean out your colon.  You should be present 60 minutes prior to your procedure or as directed by your caregiver. AFTER THE PROCEDURE   If you received a sedative or pain relieving medication, you will need to arrange for someone to drive you home.  Occasionally, there is a little blood passed with the first bowel movement. Do not be concerned. FINDING OUT THE RESULTS OF YOUR TEST Not all test results are available during your visit. If your test results are not back during the visit, make an appointment with your caregiver to find out the results. Do not assume everything is normal if you have not heard from your caregiver or the medical facility. It is important for you to follow up on all of your test results. HOME CARE INSTRUCTIONS   It is not unusual to pass moderate amounts of gas and experience mild abdominal cramping following the procedure. This is due to air being used to inflate your colon during the exam. Walking or a warm pack on your belly (abdomen) may help.  You may resume all normal meals and activities after sedatives and medicines have worn off.  Only take over-the-counter or prescription medicines for pain, discomfort, or fever as directed by your caregiver. Do not use aspirin or blood thinners if a biopsy was taken.  Consult your caregiver for medicine usage if biopsies were taken. SEEK IMMEDIATE MEDICAL CARE IF:   You have a fever.  You pass large blood clots or fill a toilet with blood following the procedure. This may also occur 10 to 14 days following the procedure. This is more likely if a biopsy was taken.  You develop abdominal pain that keeps getting worse and cannot be relieved with medicine. Document Released: 08/21/2000 Document Revised: 11/16/2011 Document Reviewed: 04/05/2008 [employer] Patient Information 2013 [place], [place]. __________________________________________________________________________________________________________  Esophagogastroduodenoscopy This is an endoscopic procedure (a procedure that uses a device like a flexible telescope) that allows your caregiver to view the upper stomach and small bowel. This test allows your caregiver to look at the esophagus. The esophagus carries food from your mouth to your stomach. They can also look at your duodenum. This is the first part of the small intestine that attaches to the stomach. This test is used to detect problems in the bowel such as ulcers and inflammation. PREPARATION FOR TEST Nothing to eat after midnight the day before the test. NORMAL FINDINGS Normal esophagus, stomach, and duodenum. Ranges for normal findings may vary among different laboratories and hospitals. You should always check with your doctor after having lab work or other tests done to discuss the meaning of your test results and whether your values are considered within normal limits. MEANING OF TEST  Your caregiver will go over the test results with you and discuss the importance and meaning of your results, as well as treatment options and the need for additional tests if necessary. OBTAINING THE TEST RESULTS It is your responsibility to obtain your test results. Ask the lab or department performing the test when and how you will get your results. Document  Released: 12/25/2004 Document Revised: 11/16/2011 Document Reviewed: 08/03/2008 [facility] Patient Information 2013 [place], [place].

## 2012-08-25 NOTE — Progress Notes (Signed)
History of Present Illness:  This is a somewhat complex 71 year old Caucasian female with known hepatitis C followed by Dr.Zacks at [facility] Department of hepatology.  She's had normal liver enzymes and apparently her liver biopsy did not show cirrhosis, so she has not been treated.  She does have chronic deafness and has had bilateral tympanoplasty by Dr. 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  She is referred today because of a guaiac positive stool on Hemoccult testing.  She denies lower gastrointestinal symptoms, melena or hematochezia.  She had apparently a negative colonoscopy in 2006.  Patient does have rather remarkable acid reflux, and does take daily Aleve but no H2 blocker or PPI medication..  One of her main complaint is pain in her right upper quadrant area subcostally without real alleviating or precipitating events except worse with food.  There is no jaundice, nausea vomiting, itching, mental status changes, or other symptoms of chronic liver disease.  There is no history of known cholelithiasis.  She's been bothered recently by bilateral edema of her lower extremities of unexplained etiology.  Recent lab data shows normal liver function tests, and normal CBC.  Family history is noncontributory.  The patient does not smoke or abuse ethanol.  I have reviewed this patient's present history, medical and surgical past history, allergies and medications.     ROS: The remainder of the 10 point ROS is negative... she carries a diagnoses of chronic delusional disorder.  She's had lower extremity edema with negative Doppler exams and no evidence of phlebitis.  The patient denies current cardiovascular or pulmonary complaints.  She does not smoke or abuse ethanol.  She denies any previous IV drug exposure history.     Physical Exam: This patient is deaf and hard to communicate with.  Blood pressure today 142/80, pulse 56 and regular, and weight 150 pounds with a BMI of 29.59.  Oxygen  saturation 97% on room air. General well developed well nourished patient in no acute distress, appearing their stated age Eyes PERRLA, no icterus, fundoscopic exam per opthamologist Skin no lesions noted Neck supple, no adenopathy, no thyroid enlargement, no tenderness Chest clear to percussion and auscultation Heart no significant murmurs, gallops or rubs noted Abdomen no hepatosplenomegaly masses or tenderness, BS normal. . Extremities no acute joint lesions, edema, phlebitis or evidence of cellulitis.  There is +1 pitting edema in both pretibial areas.  There is a psoriasiform type rash over her left anterior knee area. Neurologic patient oriented x 3, cranial nerves intact, no focal neurologic deficits noted. Psychological mental status normal and normal affect.  Assessment and plan: Guaiac positive stools of unexplained etiology.  She does have upper GI reflux symptoms, and I have placed her on daily Nexium with standard antireflux maneuvers, and scheduled endoscopic exam with propofol sedation.  Because of chronic liver disease also will obtain an upper nominal ultrasound exam to exclude hepatoma, check liver function tests, alpha-fetoprotein level, prothrombin time, and ammonia level.  She also needs followup colonoscopy exam which has been scheduled at the time her for endoscopy.  Because of her peripheral edema, I placed her on low-dose Aldactone therapy.  I cannot appreciate abdominal ascites on physical exam, and she has no asterixis or stigmata of chronic liver disease.  She is a former for followup with the [facility] liver clinic in the next several months.  It is unclear to me at this patient is under psychiatric treatment or not.  We will send this note to her primary care physician.  Encounter Diagnoses  Name Primary?  . Abdominal pain Yes  . Hepatitis C   . Heme positive stool

## 2012-08-26 ENCOUNTER — Ambulatory Visit: Payer: Medicare Other | Admitting: Gastroenterology

## 2012-08-26 LAB — CELIAC PANEL 10
Gliadin IgA: 12.9 U/mL (ref <20)
IgA: 266 mg/dL (ref 69–380)

## 2012-09-01 ENCOUNTER — Ambulatory Visit (HOSPITAL_COMMUNITY)
Admission: RE | Admit: 2012-09-01 | Discharge: 2012-09-01 | Disposition: A | Discharge status: Home / Self Care | Payer: Medicare Other | Source: Ambulatory Visit | Attending: Gastroenterology | Admitting: Gastroenterology

## 2012-09-01 DIAGNOSIS — B1920 Unspecified viral hepatitis C without hepatic coma: Secondary | ICD-10-CM

## 2012-09-01 DIAGNOSIS — R109 Unspecified abdominal pain: Secondary | ICD-10-CM | POA: Insufficient documentation

## 2012-09-12 ENCOUNTER — Encounter: Payer: Self-pay | Admitting: Gastroenterology

## 2012-09-12 ENCOUNTER — Ambulatory Visit (AMBULATORY_SURGERY_CENTER): Payer: Medicare Other | Admitting: Gastroenterology

## 2012-09-12 VITALS — BP 130/74 | HR 52 | Temp 97.9°F | Resp 24 | Ht 59.0 in | Wt 150.0 lb

## 2012-09-12 DIAGNOSIS — K2960 Other gastritis without bleeding: Secondary | ICD-10-CM

## 2012-09-12 DIAGNOSIS — R195 Other fecal abnormalities: Secondary | ICD-10-CM

## 2012-09-12 DIAGNOSIS — Z1211 Encounter for screening for malignant neoplasm of colon: Secondary | ICD-10-CM

## 2012-09-12 DIAGNOSIS — B1920 Unspecified viral hepatitis C without hepatic coma: Secondary | ICD-10-CM

## 2012-09-12 DIAGNOSIS — R109 Unspecified abdominal pain: Secondary | ICD-10-CM

## 2012-09-12 DIAGNOSIS — D649 Anemia, unspecified: Secondary | ICD-10-CM

## 2012-09-12 MED ORDER — DEXTROSE 5 % IV SOLN: INTRAVENOUS | Status: DC

## 2012-09-12 NOTE — Progress Notes (Signed)
Patient did not experience any of the following events: a burn prior to discharge; a fall within the facility; wrong site/side/patient/procedure/implant event; or a hospital transfer or hospital admission upon discharge from the facility. (G8907) Patient did not have preoperative order for IV antibiotic SSI prophylaxis. (G8918)  

## 2012-09-12 NOTE — Patient Instructions (Addendum)
Findings:  Gastritis, Hiatal Hernia, Normal Colonoscopy Recommendations:  Continue current medications, Repeat colonoscopy in 10 years, Wait for pathology results  YOU HAD AN ENDOSCOPIC PROCEDURE TODAY AT THE [facility]: Refer to the procedure report that was given to you for any specific questions about what was found during the examination.  If the procedure report does not answer your questions, please call your gastroenterologist to clarify.  If you requested that your care partner not be given the details of your procedure findings, then the procedure report has been included in a sealed envelope for you to review at your convenience later.  YOU SHOULD EXPECT: Some feelings of bloating in the abdomen. Passage of more gas than usual.  Walking can help get rid of the air that was put into your GI tract during the procedure and reduce the bloating. If you had a lower endoscopy (such as a colonoscopy or flexible sigmoidoscopy) you may notice spotting of blood in your stool or on the toilet paper. If you underwent a bowel prep for your procedure, then you may not have a normal bowel movement for a few days.  DIET: Your first meal following the procedure should be a light meal and then it is ok to progress to your normal diet.  A half-sandwich or bowl of soup is an example of a good first meal.  Heavy or fried foods are harder to digest and may make you feel nauseous or bloated.  Likewise meals heavy in dairy and vegetables can cause extra gas to form and this can also increase the bloating.  Drink plenty of fluids but you should avoid alcoholic beverages for 24 hours.  ACTIVITY: Your care partner should take you home directly after the procedure.  You should plan to take it easy, moving slowly for the rest of the day.  You can resume normal activity the day after the procedure however you should NOT DRIVE or use heavy machinery for 24 hours (because of the sedation medicines used during the  test).    SYMPTOMS TO REPORT IMMEDIATELY: A gastroenterologist can be reached at any hour.  During normal business hours, 8:30 AM to 5:00 PM Monday through Friday, call [phone].  After hours and on weekends, please call the GI answering service at [phone] who will take a message and have the physician on call contact you.   Following lower endoscopy (colonoscopy or flexible sigmoidoscopy):  Excessive amounts of blood in the stool  Significant tenderness or worsening of abdominal pains  Swelling of the abdomen that is new, acute  Fever of 100F or higher  Following upper endoscopy (EGD)  Vomiting of blood or coffee ground material  New chest pain or pain under the shoulder blades  Painful or persistently difficult swallowing  New shortness of breath  Fever of 100F or higher  Black, tarry-looking stools  FOLLOW UP: If any biopsies were taken you will be contacted by phone or by letter within the next 1-3 weeks.  Call your gastroenterologist if you have not heard about the biopsies in 3 weeks.  Our staff will call the home number listed on your records the next business day following your procedure to check on you and address any questions or concerns that you may have at that time regarding the information given to you following your procedure. This is a courtesy call and so if there is no answer at the home number and we have not heard from you through the emergency physician  on call, we will assume that you have returned to your regular daily activities without incident.  SIGNATURES/CONFIDENTIALITY: You and/or your care partner have signed paperwork which will be entered into your electronic medical record.  These signatures attest to the fact that that the information above on your After Visit Summary has been reviewed and is understood.  Full responsibility of the confidentiality of this discharge information lies with you and/or your care-partner.   Please follow all  discharge instructions given to you by the recovery room nurse. If you have any questions or problems after discharge please call one of the numbers listed above. You will receive a phone call in the am to see how you are doing and answer any questions you may have. Thank you for choosing [facility] for your health care needs.

## 2012-09-12 NOTE — Progress Notes (Signed)
Pt refuses to have care partner back in recovery,  Does not want [place] to go over any instructions with care partner.  She states "I will be fine and I will follow your instructions.  Instructions given to pt and explained along with report of both procedures.

## 2012-09-12 NOTE — Op Note (Addendum)
[facility] [address] [place], [address]   ENDOSCOPY PROCEDURE REPORT  PATIENT: Carol, Palmer  MR#: [id number] BIRTHDATE: Feb 28, 1941 , 71  yrs. old GENDER: Female ENDOSCOPIST:Ashlley Booher Hale Bogus, MD, [facility] REFERRED BY: PROCEDURE DATE:  09/12/2012 PROCEDURE:   EGD w/ biopsy for H.pylori ASA CLASS:    Class III INDICATIONS: Occult blood positive. ...chronic Hepatitis C MEDICATION: There was residual sedation effect present from prior procedure and propofol (Diprivan) 100mg  IV TOPICAL ANESTHETIC:  DESCRIPTION OF PROCEDURE:   After the risks and benefits of the procedure were explained, informed consent was obtained.  The LB GIF-H180 [id number]  endoscope was introduced through the mouth  and advanced to the second portion of the duodenum .  The instrument was slowly withdrawn as the mucosa was fully examined.      ESOPHAGUS: The mucosa of the esophagus appeared normal.   A 3 cm hiatal hernia was noted.  STOMACH: Erosive gastritis found in the gastric fundus. No varices noted.  CLO Bx. done,...NO gastric varices noted./  Retroflexed views revealed no abnormalities.    The scope was then withdrawn from the patient and the procedure completed. : DUODENUM : normal mucosa,no ulcerations or inflammation.  COMPLICATIONS: There were no complications.   ENDOSCOPIC IMPRESSION: 1.   The mucosa of the esophagus appeared normal 2.   3 cm hiatal hernia 3.   Abnormal mucosa was found in the gastric fundus ...erosive gastritis probably related to NSAID use,r/o H.pylori.  RECOMMENDATIONS: 1.  Await pathology results 2.  Continue PPI 3.  Rx CLO if positive    _______________________________ eSigned:  Mardella Layman, MD, [facility] 09/12/2012 3:51 PM      PATIENT NAME:  Carol, Palmer MR#: [id number]

## 2012-09-12 NOTE — Progress Notes (Signed)
1625 assisted pt to bathroom.  Gait steady. Passed a large amount of flatus and urine.  Assisted back to recovery bay 1 and assisted to dress.

## 2012-09-12 NOTE — Progress Notes (Signed)
Called to room to assist during endoscopic procedure.  Patient ID and intended procedure confirmed with present staff. Received instructions for my participation in the procedure from the performing physician.  

## 2012-09-12 NOTE — Op Note (Signed)
[facility] [address] [place], [address]   COLONOSCOPY PROCEDURE REPORT  PATIENT: Carol, Palmer  MR#: [id number] BIRTHDATE: [birth year] , 71  yrs. old GENDER: Female ENDOSCOPIST: Mardella Layman, MD, [facility] REFERRED BY: PROCEDURE DATE:  09/12/2012 PROCEDURE:   Colonoscopy, screening ASA CLASS:   Class III INDICATIONS:Colorectal cancer screening and occult blood . MEDICATIONS: propofol (Diprivan) 100mg  IV  DESCRIPTION OF PROCEDURE:   After the risks and benefits and of the procedure were explained, informed consent was obtained.  A digital rectal exam revealed no abnormalities of the rectum.    The LB CF-H180AL [id number]  endoscope was introduced through the anus and advanced to the terminal ileum which was intubated for a short distance .  The quality of the prep was excellent, using MoviPrep . The instrument was then slowly withdrawn as the colon was fully examined.     COLON FINDINGS: A normal appearing cecum, ileocecal valve, and appendiceal orifice were identified.  The ascending, hepatic flexure, transverse, splenic flexure, descending, sigmoid colon and rectum appeared unremarkable.  No polyps or cancers were seen. the terminal ileim also appeared normal...    Retroflexed views revealed no abnormalities.     The scope was then withdrawn from the patient and the procedure completed.  COMPLICATIONS: There were no complications. ENDOSCOPIC IMPRESSION: Normal colon ..no polyps or cancer  RECOMMENDATIONS: 1.  Continue current medications 2.  Continue current colorectal screening recommendations for "routine risk" patients with a repeat colonoscopy in 10 years. 3.  Upper endoscopy will be scheduled   REPEAT EXAM:  cc:  _______________________________ eSignedMardella Layman, MD, [facility] 09/12/2012 3:42 PM

## 2012-09-13 ENCOUNTER — Telehealth: Payer: Self-pay | Admitting: *Deleted

## 2012-09-13 NOTE — Telephone Encounter (Signed)
  Follow up Call-  Call back number 09/12/2012  Post procedure Call Back phone  # [phone]  Permission to leave phone message Yes     Patient questions:  Do you have a fever, pain , or abdominal swelling? no Pain Score  0 *  Have you tolerated food without any problems? yes  Have you been able to return to your normal activities? yes  Do you have any questions about your discharge instructions: Diet   no Medications  no Follow up visit  no  Do you have questions or concerns about your Care? no  Actions: * If pain score is 4 or above: No action needed, pain <4.

## 2012-09-15 ENCOUNTER — Encounter: Payer: Self-pay | Admitting: Gastroenterology

## 2012-09-23 ENCOUNTER — Telehealth: Payer: Self-pay | Admitting: Gastroenterology

## 2012-09-24 ENCOUNTER — Other Ambulatory Visit: Payer: Self-pay | Admitting: Gastroenterology

## 2012-09-26 NOTE — Telephone Encounter (Signed)
lmom for pt to call back

## 2012-09-29 NOTE — Telephone Encounter (Signed)
Gave pt her lab results and she requested I mail her a copy and a copy of the ones when she 1st came in 2008. Done.

## 2012-11-25 ENCOUNTER — Ambulatory Visit: Payer: Medicare Other | Admitting: Gastroenterology

## 2012-12-05 IMAGING — US US BIOPSY
1 series · 8 of 8 positions shown · non-contrast
Comparison: none

CLINICAL HISTORY: Hepatitis C.

[Series 1: us biopsy · 0.22mm/px · 8 acquisitions, 8 frames shown]
[im 1/8]
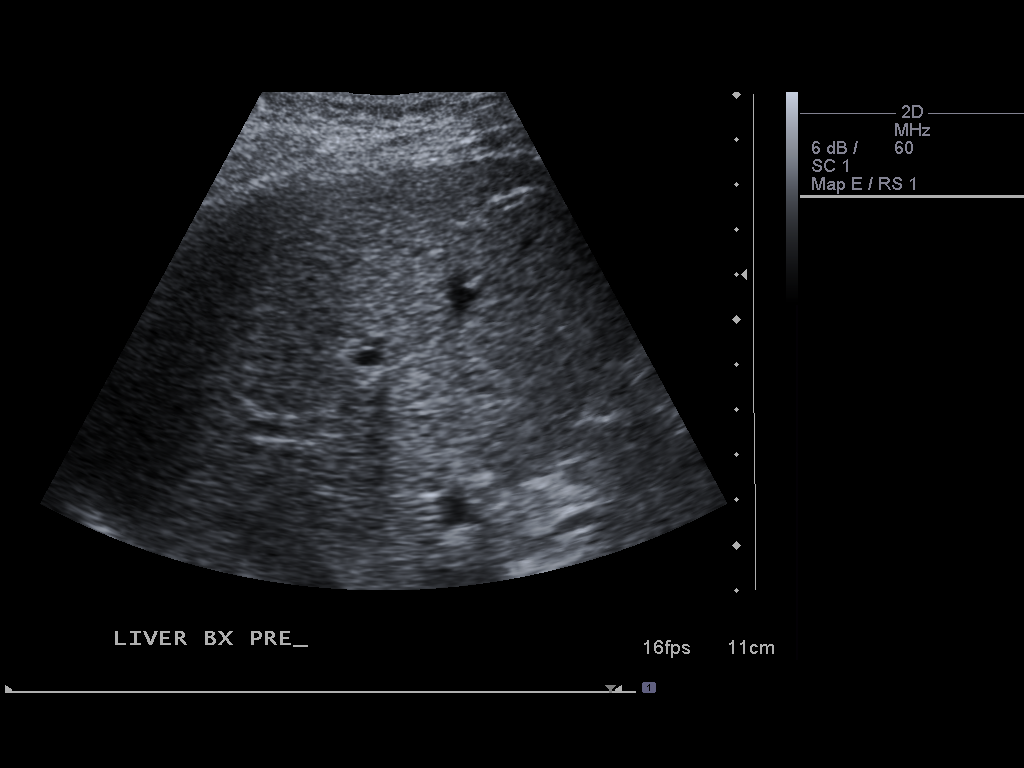
[im 2/8]
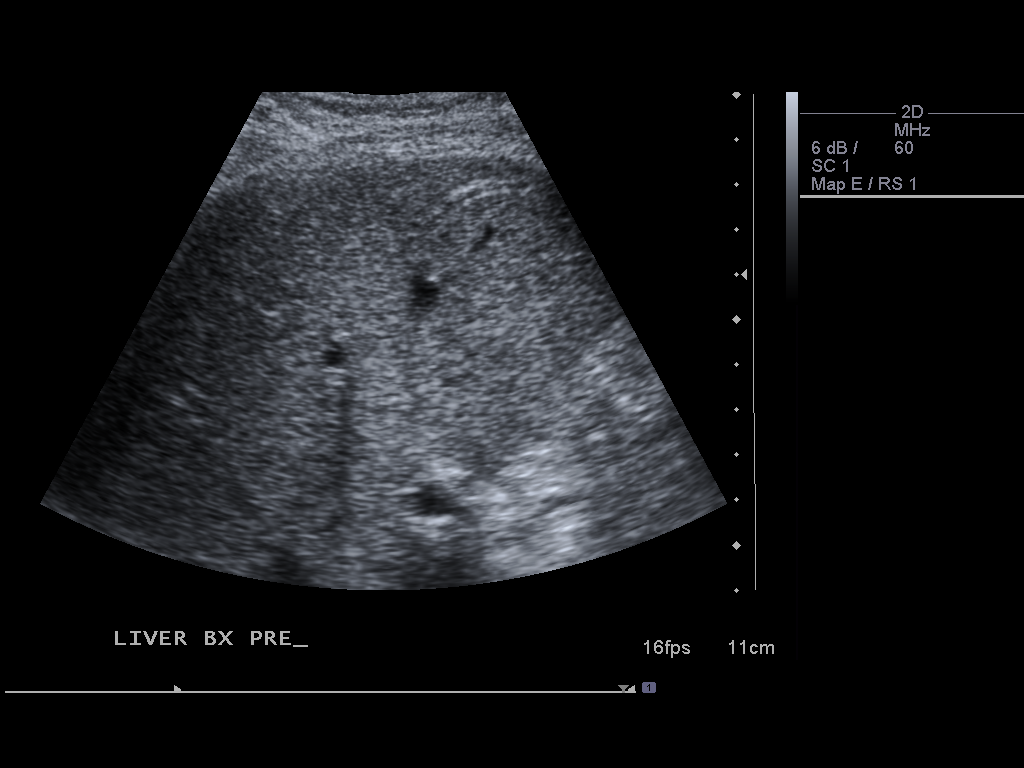
[im 3/8]
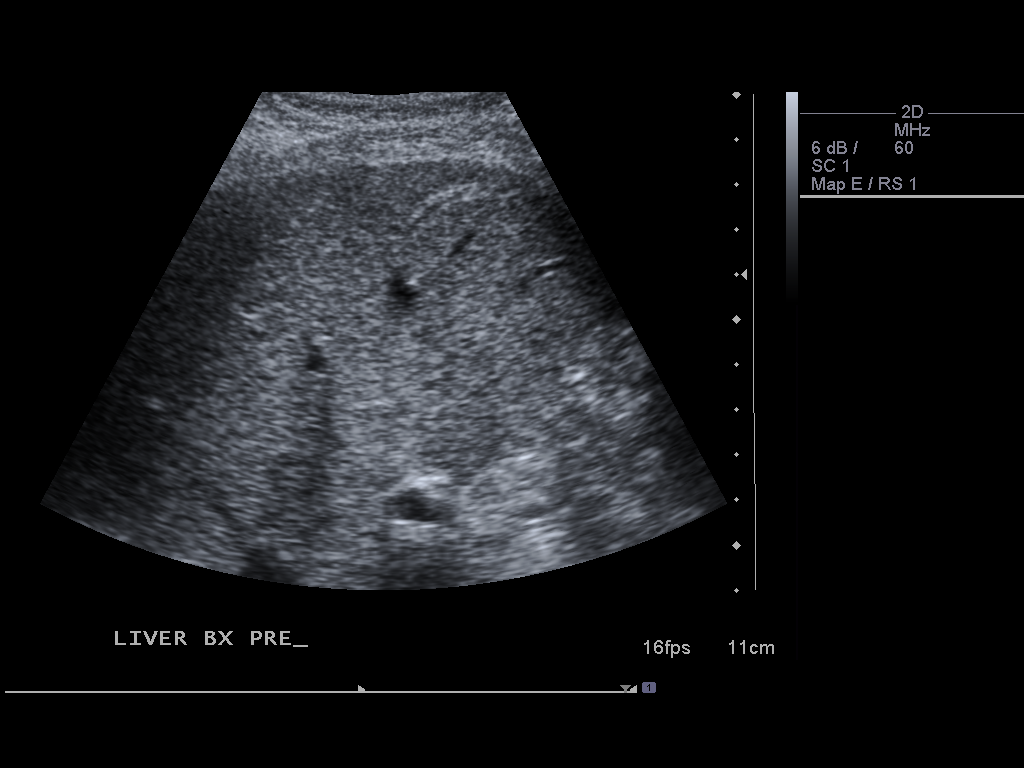
[im 4/8]
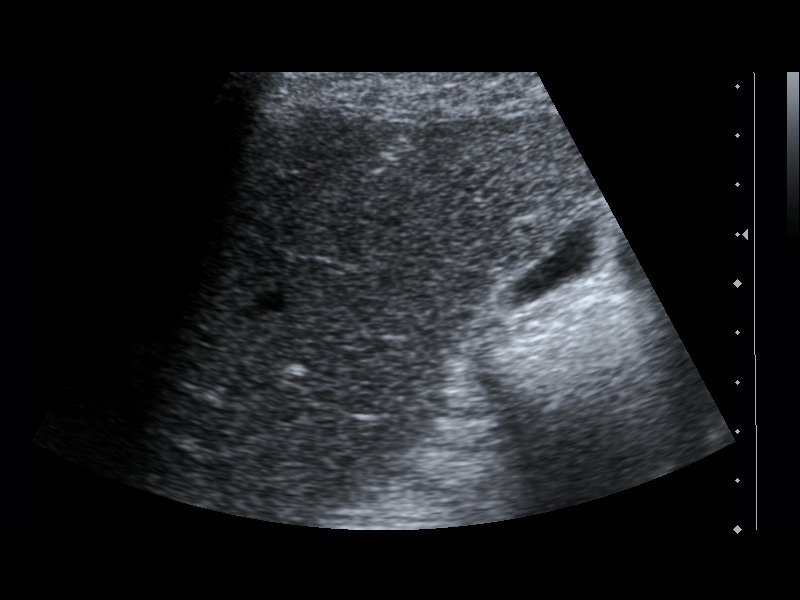
[im 5/8]
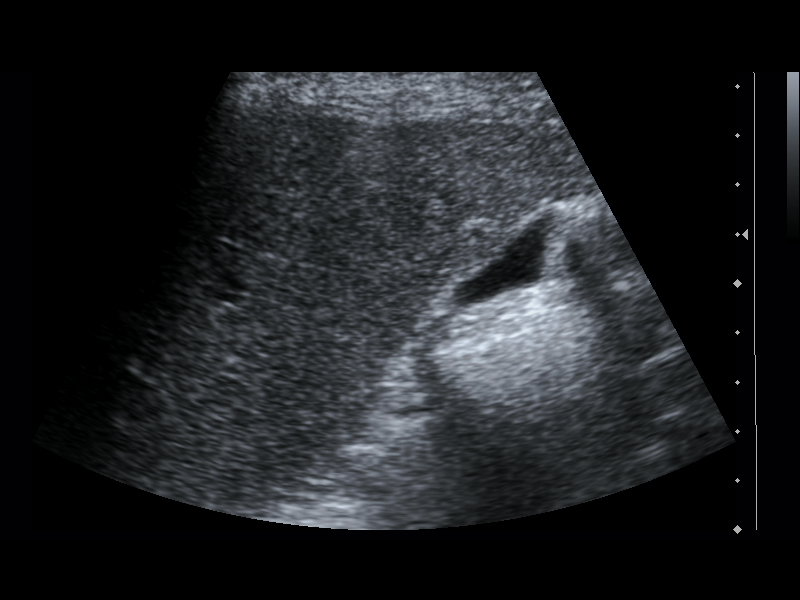
[im 6/8]
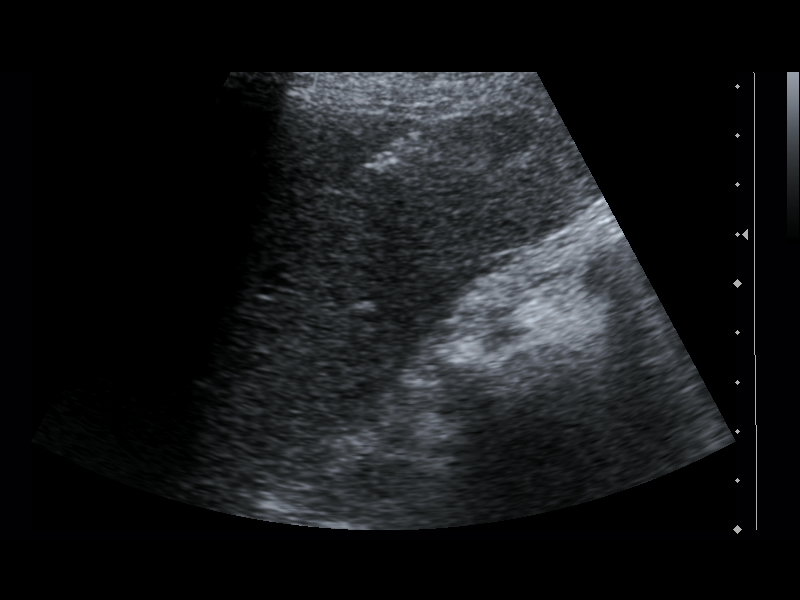
[im 7/8]
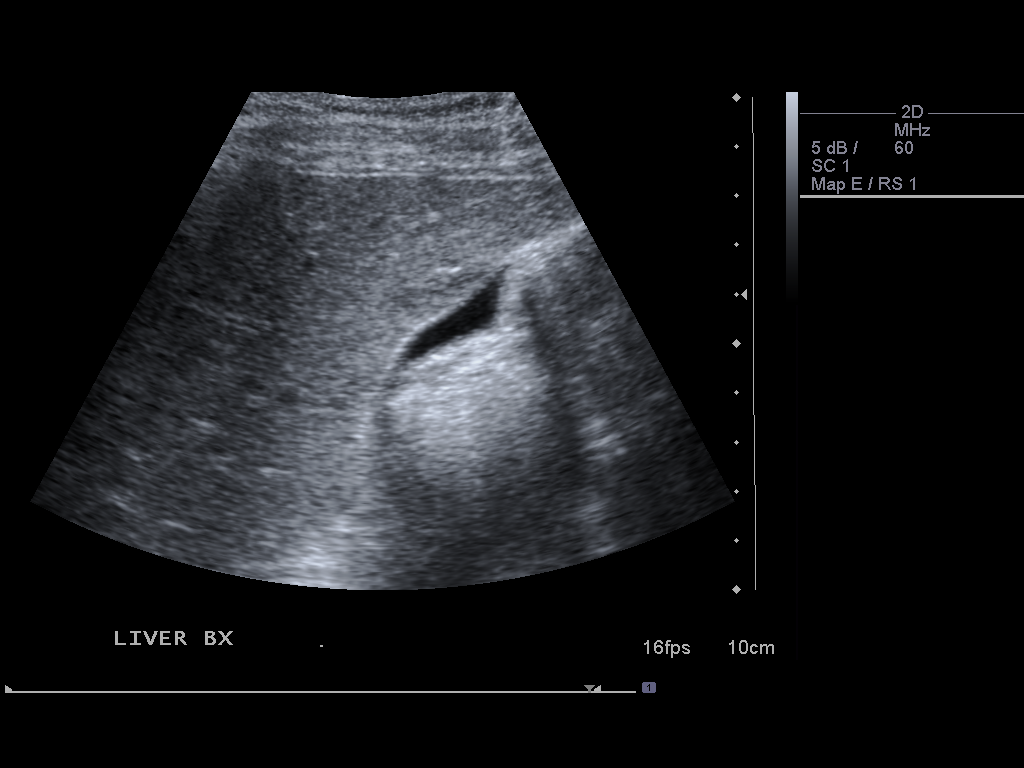
[im 8/8]
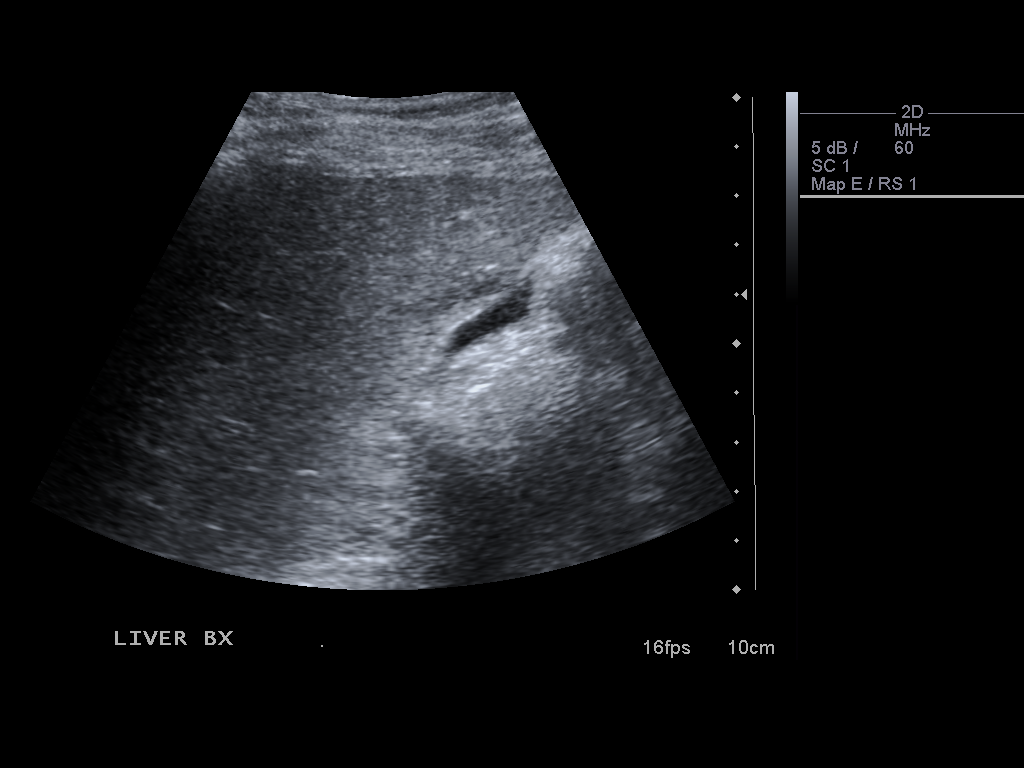

[8 of 8 positions shown; findings below may reference images not displayed]

PROCEDURE(S): ULTRASOUND GUIDED LIVER BIOPSY.

Medications:Versed 2 mg, Fentanyl 100 mcg.  A radiology nurse
monitored the patient for moderate sedation.

Moderate sedation time:20 minutes

Fluoroscopy time: None

Procedure:The procedure was explained to the patient.  The risks
and benefits of the procedure were discussed and the patient's
questions were addressed.  Informed consent was obtained from the
patient.   The liver was evaluated with ultrasound.  The right
hepatic lobe was selected for biopsy.  The right abdomen was
prepped and draped in a sterile fashion.  The skin was anesthetized
with lidocaine.  Using ultrasound guidance, a 17 gauge needle was
directed in the right hepatic lobe.  Three core biopsies were
obtained with an 18 gauge device.  Samples placed in Formalyn.  17
gauge needle was removed without complication.
FINDINGS: Core biopsies obtained from the right hepatic lobe.

Complications: None
IMPRESSION: Successful ultrasound guided core biopsies of the right
hepatic lobe.

## 2013-03-07 IMAGING — CR DG FOOT COMPLETE 3+V*L*
3 series · 3 of 3 positions shown · non-contrast
Comparison: None.

CLINICAL DATA: Left foot pain and swelling

LEFT FOOT - COMPLETE 3+ VIEW

[view not recorded (1 of 3)]
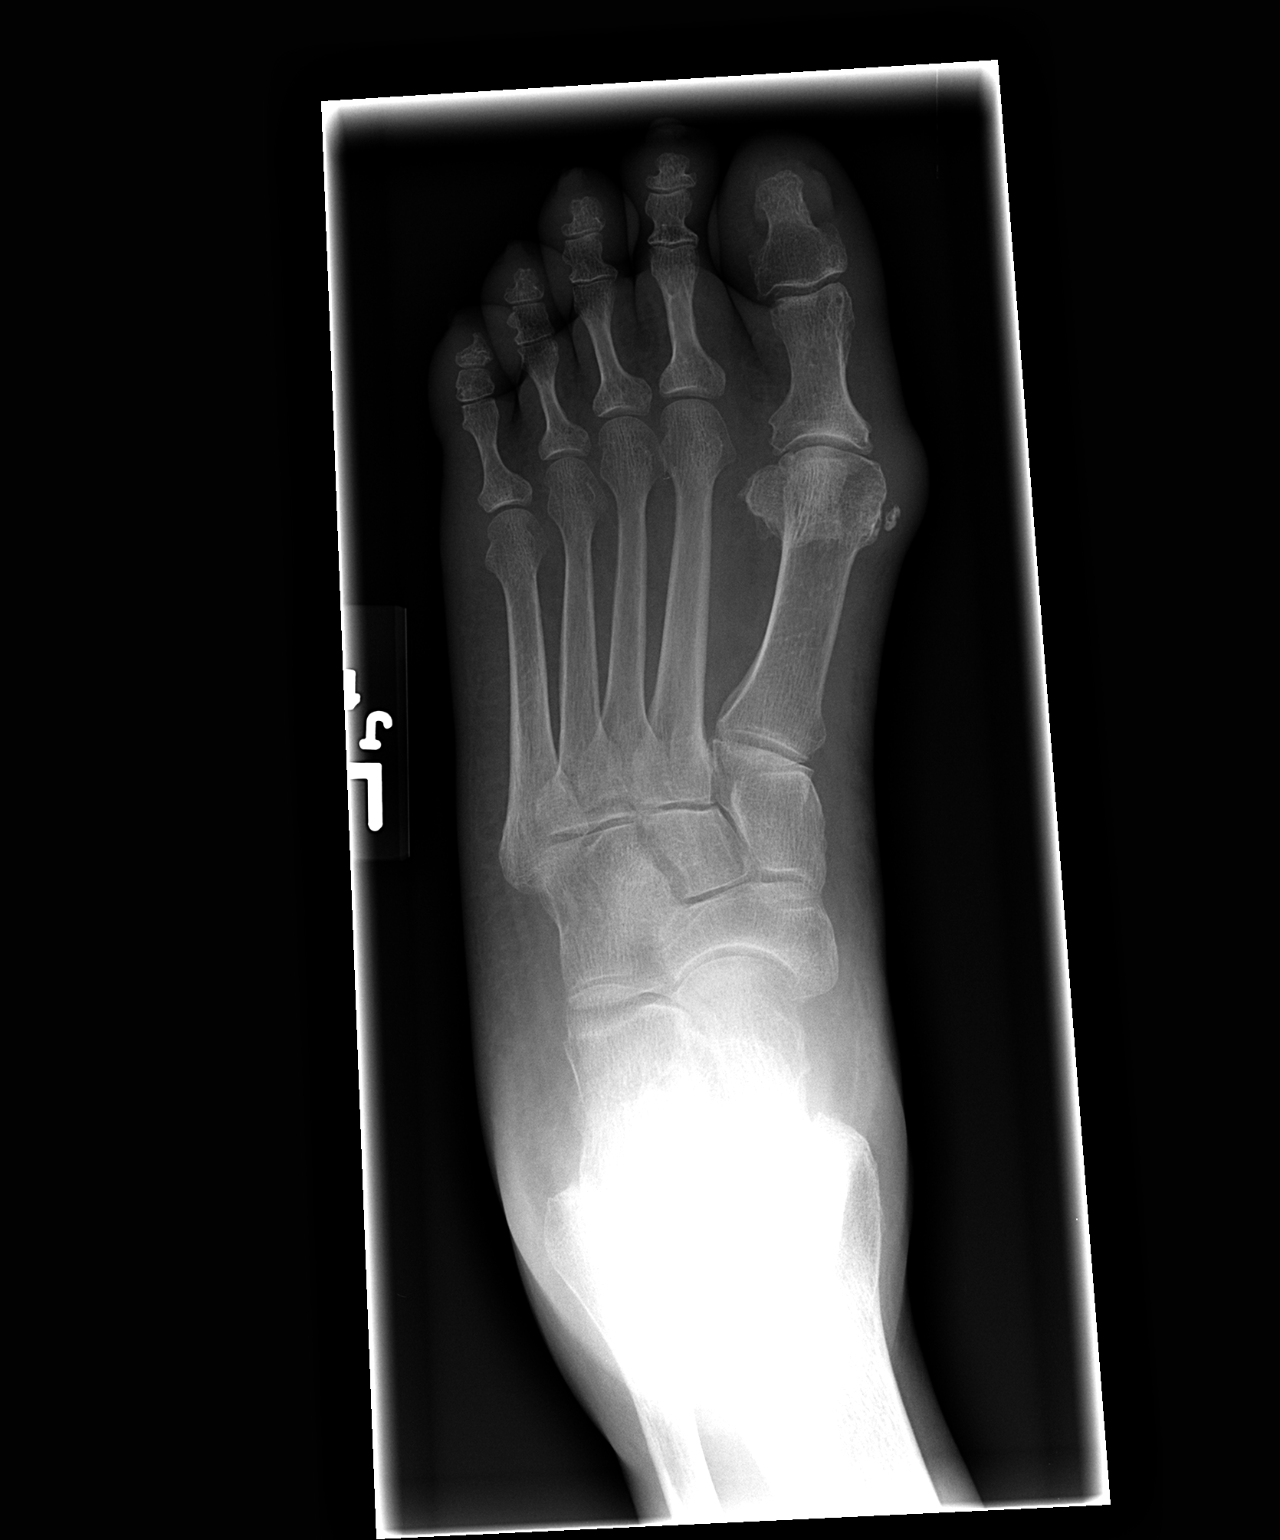

[view not recorded (2 of 3)]
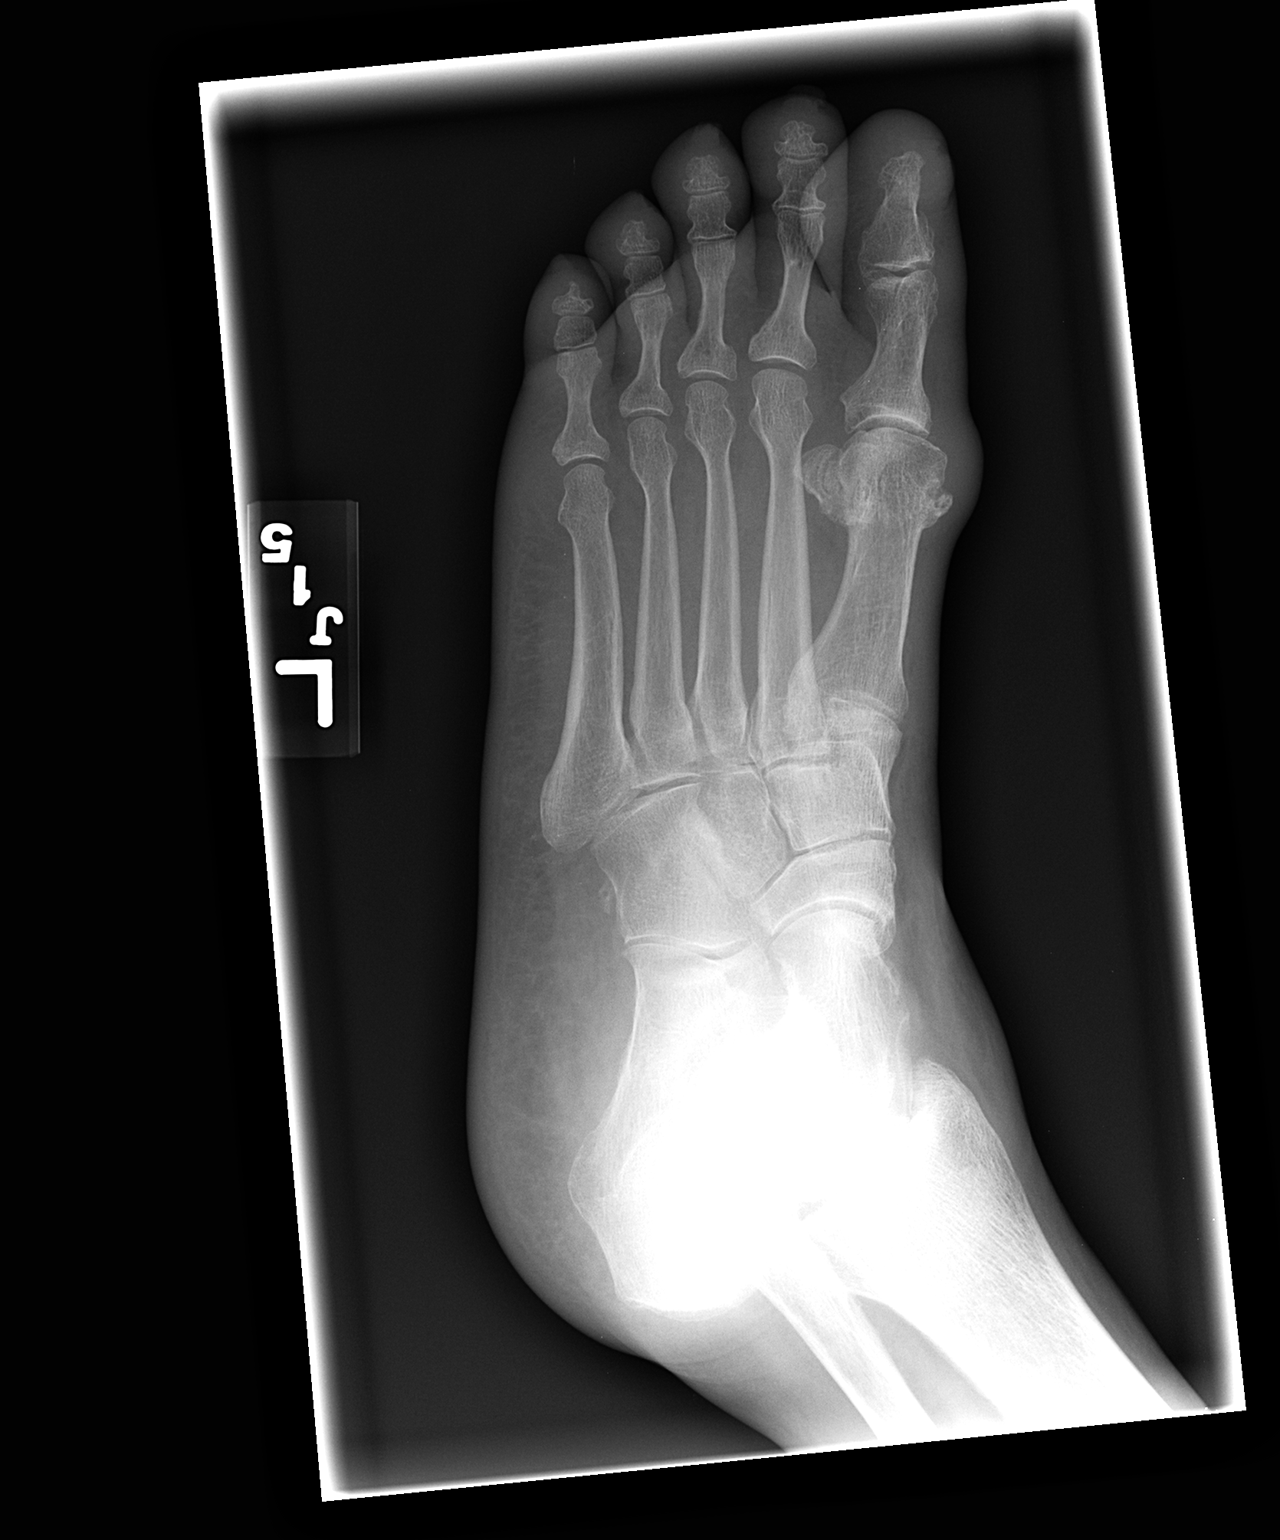

[view not recorded (3 of 3)]
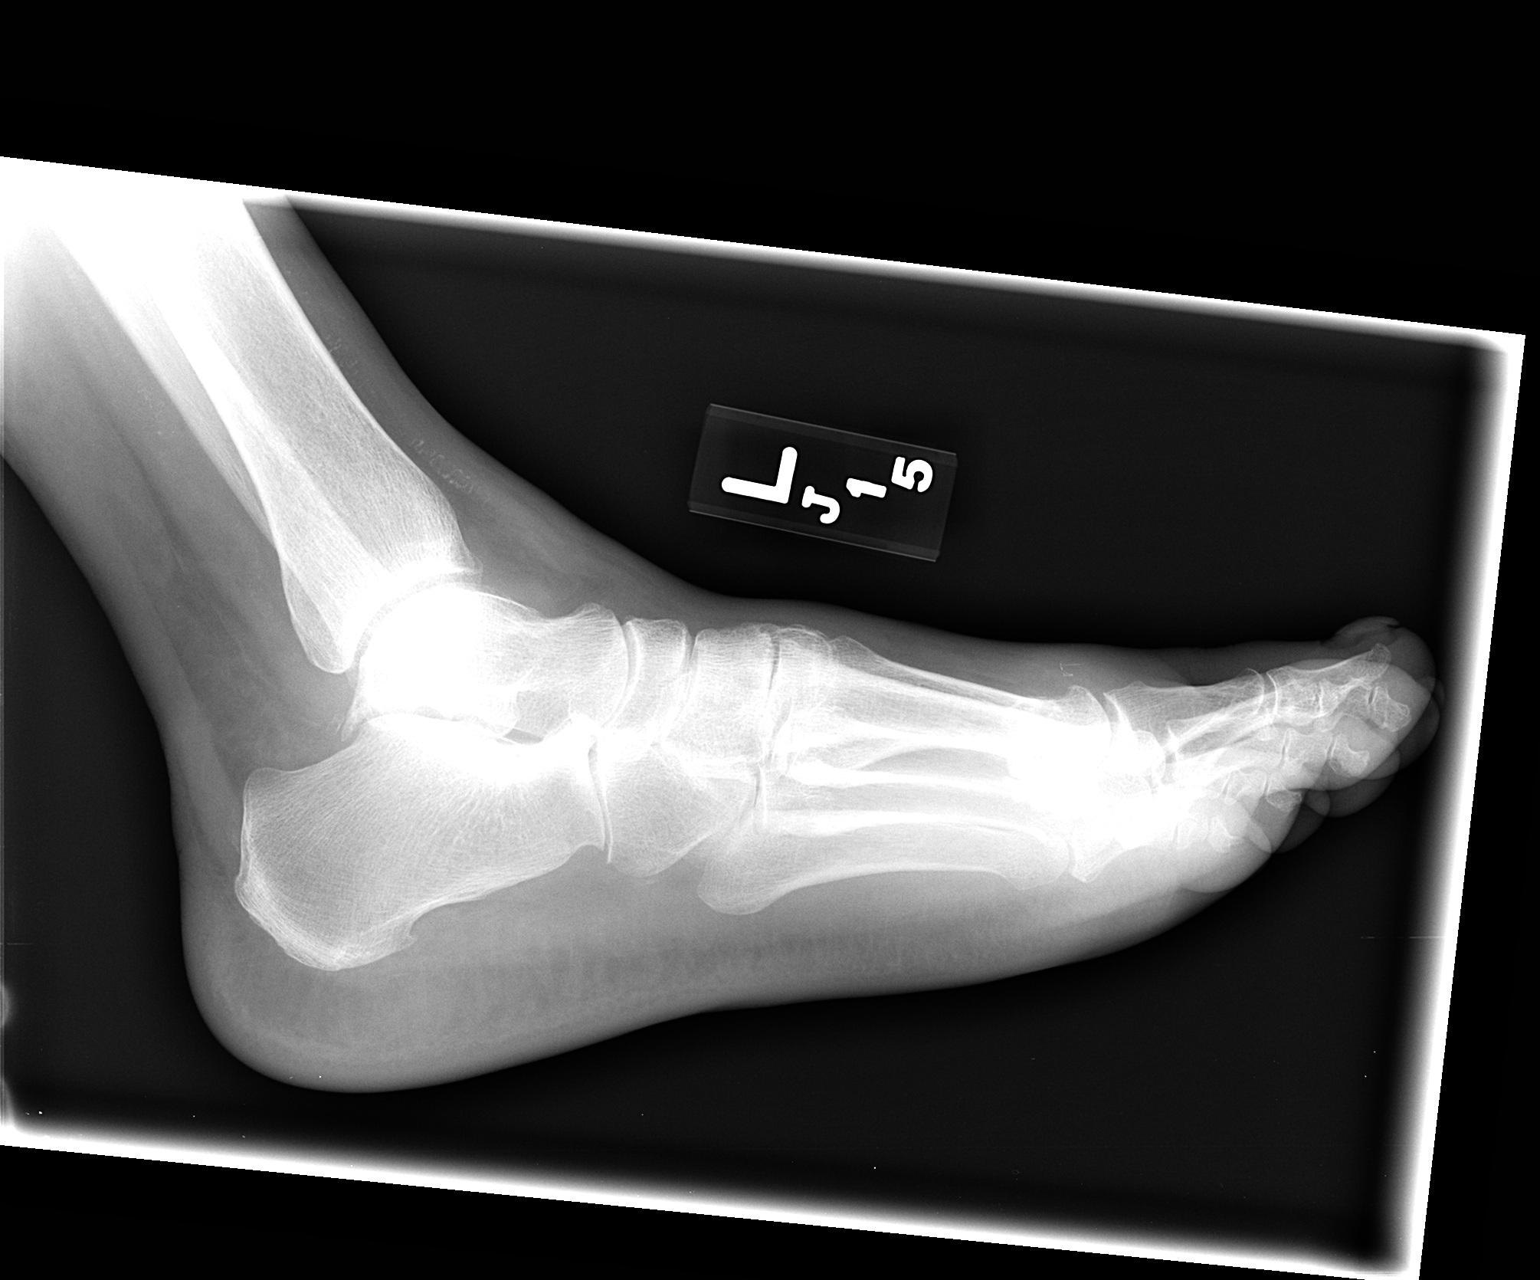

[3 of 3 positions shown; findings below may reference images not displayed]

FINDINGS: No acute fracture.  No dislocation.  Degenerative changes
at the first metatarsophalangeal joint are present with overlying
soft tissue swelling. Curvilinear radio opaque densities are seen
in the soft tissues of the dorsal forefoot of unknown significance.
IMPRESSION: No acute bony pathology.

Radiopaque foreign bodies in the dorsal soft tissues may be
present.

## 2013-09-12 ENCOUNTER — Telehealth: Payer: Self-pay | Admitting: Gastroenterology

## 2013-09-13 NOTE — Telephone Encounter (Signed)
I called...she wants her records sent t Dr. York Ram.

## 2013-09-13 NOTE — Telephone Encounter (Signed)
Dr Sharlett Iles, pt request you call her before you retire. Thanks.

## 2013-09-14 ENCOUNTER — Telehealth: Payer: Self-pay | Admitting: *Deleted

## 2013-09-14 NOTE — Telephone Encounter (Signed)
Pt called back and she does need her records sent to Dr Jimmye Norman. Faxed notes to [phone].

## 2013-09-14 NOTE — Telephone Encounter (Signed)
Pt called back to report she already has a copy of her records.

## 2013-11-03 ENCOUNTER — Other Ambulatory Visit: Payer: Self-pay | Admitting: Internal Medicine

## 2013-11-03 DIAGNOSIS — C220 Liver cell carcinoma: Secondary | ICD-10-CM

## 2013-11-08 ENCOUNTER — Other Ambulatory Visit: Payer: Medicare Other

## 2013-12-11 ENCOUNTER — Other Ambulatory Visit: Payer: Medicare Other

## 2014-05-02 ENCOUNTER — Ambulatory Visit: Payer: Self-pay | Admitting: Podiatrist

## 2014-05-04 ENCOUNTER — Ambulatory Visit: Payer: Medicare Other | Admitting: Podiatrist

## 2014-05-18 ENCOUNTER — Ambulatory Visit: Payer: Medicare Other | Admitting: Podiatrist

## 2014-06-07 ENCOUNTER — Emergency Department: Payer: Self-pay | Admitting: Emergency Medicine

## 2014-06-20 ENCOUNTER — Encounter (HOSPITAL_COMMUNITY): Payer: Self-pay | Admitting: Emergency Medicine

## 2014-06-20 ENCOUNTER — Emergency Department (HOSPITAL_COMMUNITY)
Admission: EM | Admit: 2014-06-20 | Discharge: 2014-06-20 | Disposition: A | Discharge status: Home / Self Care | Payer: Medicare Other | Attending: Emergency Medicine | Admitting: Emergency Medicine

## 2014-06-20 DIAGNOSIS — F22 Delusional disorders: Secondary | ICD-10-CM

## 2014-06-20 DIAGNOSIS — Z8701 Personal history of pneumonia (recurrent): Secondary | ICD-10-CM | POA: Diagnosis not present

## 2014-06-20 DIAGNOSIS — Z8619 Personal history of other infectious and parasitic diseases: Secondary | ICD-10-CM | POA: Insufficient documentation

## 2014-06-20 DIAGNOSIS — Z8719 Personal history of other diseases of the digestive system: Secondary | ICD-10-CM | POA: Insufficient documentation

## 2014-06-20 DIAGNOSIS — F29 Unspecified psychosis not due to a substance or known physiological condition: Secondary | ICD-10-CM | POA: Insufficient documentation

## 2014-06-20 DIAGNOSIS — M069 Rheumatoid arthritis, unspecified: Secondary | ICD-10-CM | POA: Diagnosis not present

## 2014-06-20 DIAGNOSIS — H409 Unspecified glaucoma: Secondary | ICD-10-CM | POA: Insufficient documentation

## 2014-06-20 DIAGNOSIS — Z79899 Other long term (current) drug therapy: Secondary | ICD-10-CM | POA: Diagnosis not present

## 2014-06-20 DIAGNOSIS — F200 Paranoid schizophrenia: Secondary | ICD-10-CM | POA: Diagnosis present

## 2014-06-20 NOTE — Discharge Instructions (Signed)
Paranoia °Paranoia is a distrust of others that is not based on a real reason for distrust. This may reach delusional levels. This means the paranoid person feels the world is against them when there is no reason to make them feel that way. People with paranoia feel as though people around them are "out to get them".  °SIMILAR MENTAL ILNESSES °· Depression is a feeling as though you are down all the time. It is normal in some situations where you have just lost a loved one. It is abnormal if you are having feelings of paranoia with it. °· Dementia is a physical problem with the brain in which the brain no longer works properly. There are problems with daily activities of living. Alzheimer's disease is one example of this. Dementia is also caused by old age changes in the brain which come with the death of brain cells and small strokes. °· Paranoidschizophrenia. People with paranoid schizophrenia and persecutory delusional disorder have delusions in which they feel people around them are plotting against them. Persecutory delusions in paranoid schizophrenia are bizarre, sometimes grandiose, and often accompanied by auditory hallucinations. This means the person is hearing voices that are not there. °· Delusionaldisorder (persecutory type). Delusions experienced by individuals with delusional disorder are more believable than those experienced by paranoid schizophrenics; they are not bizarre, though still unjustified. Individuals with delusional disorder may seem offbeat or quirky rather than mentally ill, and therefore, may never seek treatment. °All of these problems usually do not allow these people to interact socially in an acceptable manner. °CAUSES °The cause of paranoia is often not known. It is common in people with extended abuse of: °· Cocaine. °· Amphetamine. °· Marijuana. °· Alcohol. °Sometimes there is an inherited tendency. It may be associated with stress or changes in brain chemistry. °DIAGNOSIS    °When paranoia is present, your caregiver may: °· Refer you to a specialist. °· Do a physical exam. °· Perform other tests on you to make sure there are not other problems causing the paranoia including: °¨ Physical problems. °¨ Mental problems. °¨ Chemical problems (other than drugs). °Testing may be done to determine if there is a psychiatric disability present that can be treated with medicine. °TREATMENT  °· Paranoia that is a symptom of a psychiatric problem should be treated by professionals. °· Medicines are available which can help this disorder. Antipsychotic medicine may be prescribed by your caregiver. °· Sometimes psychotherapy may be useful. °· Conditions such as depression or drug abuse are treated individually. If the paranoia is caused by drug abuse, a treatment facility may be helpful. Depression may be helped by antidepressants. °PROGNOSIS  °· Paranoid people are difficult to treat because of their belief that everyone is out to get them or harm them. Because of this mistrust, they often must be talked into entering treatment by a trusted family member or friend. They may not want to take medicine as they may see this as an attempt to poison them. °· Gradual gains in the trust of a therapist or caregiver helps in a successful treatment plan. °· Some people with PPD or persecutory delusional disorder function in society without treatment in limited fashion. °Document Released: 08/27/2003 Document Revised: 11/16/2011 Document Reviewed: 05/01/2008 °ExitCare® Patient Information ©2015 [employer]. This information is not intended to replace advice given to you by your health care provider. Make sure you discuss any questions you have with your health care provider. ° °

## 2014-06-20 NOTE — ED Notes (Signed)
MD at bedside. 
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
## 2014-06-20 NOTE — ED Notes (Signed)
Pt also reports numbness from her hips to her toes bilaterally.
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
## 2014-07-03 ENCOUNTER — Encounter (HOSPITAL_COMMUNITY): Payer: Self-pay | Admitting: Emergency Medicine

## 2014-07-03 ENCOUNTER — Emergency Department (HOSPITAL_COMMUNITY): Payer: Medicare Other

## 2014-07-03 ENCOUNTER — Emergency Department (HOSPITAL_COMMUNITY)
Admission: EM | Admit: 2014-07-03 | Discharge: 2014-07-04 | Disposition: A | Discharge status: Home / Self Care | Payer: Medicare Other | Attending: Emergency Medicine | Admitting: Emergency Medicine

## 2014-07-03 DIAGNOSIS — Z79899 Other long term (current) drug therapy: Secondary | ICD-10-CM | POA: Diagnosis not present

## 2014-07-03 DIAGNOSIS — H409 Unspecified glaucoma: Secondary | ICD-10-CM | POA: Insufficient documentation

## 2014-07-03 DIAGNOSIS — Z8701 Personal history of pneumonia (recurrent): Secondary | ICD-10-CM | POA: Insufficient documentation

## 2014-07-03 DIAGNOSIS — Z8739 Personal history of other diseases of the musculoskeletal system and connective tissue: Secondary | ICD-10-CM | POA: Diagnosis not present

## 2014-07-03 DIAGNOSIS — Z8619 Personal history of other infectious and parasitic diseases: Secondary | ICD-10-CM | POA: Insufficient documentation

## 2014-07-03 DIAGNOSIS — R0789 Other chest pain: Secondary | ICD-10-CM

## 2014-07-03 DIAGNOSIS — Z8719 Personal history of other diseases of the digestive system: Secondary | ICD-10-CM | POA: Insufficient documentation

## 2014-07-03 DIAGNOSIS — H9190 Unspecified hearing loss, unspecified ear: Secondary | ICD-10-CM | POA: Insufficient documentation

## 2014-07-03 DIAGNOSIS — R079 Chest pain, unspecified: Secondary | ICD-10-CM

## 2014-07-03 LAB — CBC
HEMATOCRIT: 39.6 % (ref 36.0–46.0)
Hemoglobin: 12.3 g/dL (ref 12.0–15.0)
MCH: 22.6 pg — ABNORMAL LOW (ref 26.0–34.0)
MCHC: 31.1 g/dL (ref 30.0–36.0)
MCV: 72.7 fL — ABNORMAL LOW (ref 78.0–100.0)
PLATELETS: 351 10*3/uL (ref 150–400)
RBC: 5.45 MIL/uL — AB (ref 3.87–5.11)
RDW: 13.6 % (ref 11.5–15.5)
WBC: 5.6 10*3/uL (ref 4.0–10.5)

## 2014-07-03 LAB — BASIC METABOLIC PANEL
ANION GAP: 15 (ref 5–15)
BUN: 10 mg/dL (ref 6–23)
CHLORIDE: 96 meq/L (ref 96–112)
CO2: 33 mEq/L — ABNORMAL HIGH (ref 19–32)
Calcium: 9.7 mg/dL (ref 8.4–10.5)
Creatinine, Ser: 0.83 mg/dL (ref 0.50–1.10)
GFR calc non Af Amer: 69 mL/min — ABNORMAL LOW (ref >90)
GFR, EST AFRICAN AMERICAN: 80 mL/min — AB (ref >90)
Glucose, Bld: 92 mg/dL (ref 70–99)
POTASSIUM: 3.2 meq/L — AB (ref 3.7–5.3)
Sodium: 144 mEq/L (ref 137–147)

## 2014-07-03 LAB — PRO B NATRIURETIC PEPTIDE: Pro B Natriuretic peptide (BNP): 54.5 pg/mL (ref 0–125)

## 2014-07-03 LAB — I-STAT TROPONIN, ED: TROPONIN I, POC: 0.01 ng/mL (ref 0.00–0.08)

## 2014-07-03 NOTE — ED Notes (Signed)
Pt. Reports one episode of vomiting this AM. Then tonight had sudden onset sharp chest pain, states "pain is around my heart". Reports SOB and weakness.

## 2014-07-03 NOTE — ED Provider Notes (Signed)
CSN: [id number]     Arrival date & time 07/03/14  2134 History   First MD Initiated Contact with Patient 07/03/14 2338     Chief Complaint  Patient presents with  . Chest Pain     (Consider location/radiation/quality/duration/timing/severity/associated sxs/prior Treatment) HPI Two days of sudden intermittent localized nonradiating left lower anterior chest with chest pains last about an hour at a time with shortness of breath, worse with torso position changes and palpation, nonexertional, worse with breathing, few spells per day, one episode nonbloody emesis this AM, has mild cough but no fever.  Past Medical History  Diagnosis Date  . Glaucoma   . HCV (hepatitis C virus)     Dr. Patsy [place]   . Ear infection   . Hard of hearing   . Rheumatoid arthritis(714.0)   . Pneumonia   . Cirrhosis   Chronic paranoia and delusions Past Surgical History  Procedure Laterality Date  . Inner ear surgery    . Eye surgery     Family History  Problem Relation Age of Onset  . Diabetes Father   . Pneumonia Mother   . Diabetes Brother   . Osteoporosis Mother    History  Substance Use Topics  . Smoking status: Never Smoker   . Smokeless tobacco: Never Used  . Alcohol Use: No   OB History    No data available     Review of Systems 10 Systems reviewed and are negative for acute change except as noted in the HPI.   Allergies  Fish allergy  Home Medications   Prior to Admission medications   Medication Sig Start Date End Date Taking? Authorizing Provider  bimatoprost (LUMIGAN) 0.03 % ophthalmic solution Place 1 drop into the left eye at bedtime.   Yes Historical Provider, MD  dorzolamide (TRUSOPT) 2 % ophthalmic solution Place 1 drop into the left eye every 12 (twelve) hours.    Yes Historical Provider, MD  pilocarpine (PILOCAR) 2 % ophthalmic solution Place 1 drop into the left eye 4 (four) times daily.   Yes Historical Provider, MD  timolol (BETIMOL) 0.5 % ophthalmic solution Place 1  drop into the left eye 2 (two) times daily.    Yes Historical Provider, MD  esomeprazole (NEXIUM) 40 MG capsule Take 1 capsule (40 mg total) by mouth daily. 08/25/12 08/25/13  Sable Feil, MD   BP 192/92 mmHg  Pulse 90  Temp(Src) 98.1 F (36.7 C) (Oral)  Resp 26  SpO2 93% Physical Exam  Nursing note and vitals reviewed. Constitutional:  Awake, alert, nontoxic appearance.  HENT:  Head: Atraumatic.  Eyes: Right eye exhibits no discharge. Left eye exhibits no discharge.  Neck: Neck supple.  Cardiovascular: Normal rate and regular rhythm.   No murmur heard. Pulmonary/Chest: Effort normal and breath sounds normal. No respiratory distress. She has no wheezes. She has no rales. She exhibits tenderness.  Reproducible left chest wall tenderness with normal room air pulse ox 100%  Abdominal: Soft. There is no tenderness. There is no rebound.  Musculoskeletal: She exhibits no tenderness.  Baseline ROM, no obvious new focal weakness.  Neurological:  Mental status and motor strength appears baseline for patient and situation.  Skin: No rash noted.  Psychiatric: She has a normal mood and affect.    ED Course  Procedures (including critical care time) Labs Review Labs Reviewed  CBC - Abnormal; Notable for the following:    RBC 5.45 (*)    MCV 72.7 (*)    MCH 22.6 (*)  All other components within normal limits  BASIC METABOLIC PANEL - Abnormal; Notable for the following:    Potassium 3.2 (*)    CO2 33 (*)    GFR calc non Af Amer 69 (*)    GFR calc Af Amer 80 (*)    All other components within normal limits  PRO B NATRIURETIC PEPTIDE  I-STAT TROPOININ, ED    Imaging Review No results found.   EKG Interpretation   Date/Time:  Tuesday July 03 2014 21:41:32 EDT Ventricular Rate:  87 PR Interval:  136 QRS Duration: 84 QT Interval:  386 QTC Calculation: 464 R Axis:   56 Text Interpretation:  Normal sinus rhythm Normal ECG No significant change  since last tracing  Confirmed by [facility]  MD, Jenny Reichmann ([id number]) on 07/03/2014  11:40:45 PM      MDM   Final diagnoses:  Chest wall pain    Patient / Family / Caregiver informed of clinical course, understand medical decision-making process, and agree with plan. I doubt any other EMC precluding discharge at this time including, but not necessarily limited to the following:[facility].    Babette Relic, MD 07/08/14 2034

## 2014-07-04 NOTE — ED Notes (Signed)
Pt stating she needs to leave d/t transportation issues, states she is unable to get a ride if she doesn't;t leave now.

## 2014-07-04 NOTE — ED Notes (Signed)
Pt continues to have active chest pain, however insists on leaving. EDP notified and discharge orders given.

## 2014-07-04 NOTE — ED Notes (Signed)
Gave pt 2 apple sauces

## 2014-07-04 NOTE — Discharge Instructions (Signed)
You have been diagnosed by your caregiver as having chest wall pain. SEEK IMMEDIATE MEDICAL ATTENTION IF: You develop a fever.  Your chest pains become severe or intolerable.  You develop new, unexplained symptoms (problems).  You develop new shortness of breath, nausea, vomiting, sweating or feel light headed.  You develop a new cough or you cough up blood.  Your caregiver has diagnosed you as having chest pain that is not specific for one problem, but does not require admission.  You are at low risk for an acute heart condition or other serious illness. Chest pain comes from many different causes.  SEEK IMMEDIATE MEDICAL ATTENTION IF: You have severe chest pain, especially if the pain is crushing or pressure-like and spreads to the arms, back, neck, or jaw, or if you have sweating, nausea (feeling sick to your stomach), or shortness of breath. THIS IS AN EMERGENCY. Don't wait to see if the pain will go away. Get medical help at once. Call 911 or 0 (operator). DO NOT drive yourself to the hospital.  Your chest pain gets worse and does not go away with rest.  You have an attack of chest pain lasting longer than usual, despite rest and treatment with the medications your caregiver has prescribed.  You wake from sleep with chest pain or shortness of breath.  You feel dizzy or faint.  You have chest pain not typical of your usual pain for which you originally saw your caregiver.

## 2014-07-04 NOTE — ED Notes (Signed)
MD at bedside. 

## 2014-10-23 ENCOUNTER — Other Ambulatory Visit: Payer: Self-pay | Admitting: Otolaryngology

## 2014-10-23 DIAGNOSIS — E042 Nontoxic multinodular goiter: Secondary | ICD-10-CM

## 2020-07-08 DEATH — deceased
